# Patient Record
Sex: Male | Born: 1990 | Race: White | Hispanic: No | Marital: Single | Smoking: Former smoker
Health system: Southern US, Community
[De-identification: ages and names within clinical notes are randomized; demographics above are authoritative.]

## PROBLEM LIST (undated history)

## (undated) DIAGNOSIS — F329 Major depressive disorder, single episode, unspecified: Secondary | ICD-10-CM

## (undated) DIAGNOSIS — L03012 Cellulitis of left finger: Secondary | ICD-10-CM

## (undated) HISTORY — DX: Major depressive disorder, single episode, unspecified: F32.9

## (undated) HISTORY — DX: Cellulitis of left finger: L03.012

---
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
## 2010-06-11 ENCOUNTER — Ambulatory Visit: Payer: Self-pay | Admitting: Otolaryngology

## 2010-06-12 LAB — PATHOLOGY REPORT

## 2010-11-02 DIAGNOSIS — F32A Depression, unspecified: Secondary | ICD-10-CM

## 2010-11-02 HISTORY — DX: Depression, unspecified: F32.A

## 2013-11-02 DIAGNOSIS — L03012 Cellulitis of left finger: Secondary | ICD-10-CM

## 2013-11-02 HISTORY — DX: Cellulitis of left finger: L03.012

## 2016-01-13 ENCOUNTER — Ambulatory Visit: Payer: Self-pay | Admitting: Family Medicine

## 2016-05-25 ENCOUNTER — Encounter: Payer: Self-pay | Admitting: Family Medicine

## 2016-05-25 ENCOUNTER — Ambulatory Visit (INDEPENDENT_AMBULATORY_CARE_PROVIDER_SITE_OTHER): Payer: Self-pay | Admitting: Family Medicine

## 2016-05-25 VITALS — BP 124/78 | HR 68 | Temp 98.2°F | Ht 71.75 in | Wt 184.5 lb

## 2016-05-25 DIAGNOSIS — Z0000 Encounter for general adult medical examination without abnormal findings: Secondary | ICD-10-CM | POA: Insufficient documentation

## 2016-05-25 DIAGNOSIS — Z113 Encounter for screening for infections with a predominantly sexual mode of transmission: Secondary | ICD-10-CM | POA: Diagnosis not present

## 2016-05-25 DIAGNOSIS — Z131 Encounter for screening for diabetes mellitus: Secondary | ICD-10-CM

## 2016-05-25 DIAGNOSIS — Z13220 Encounter for screening for lipoid disorders: Secondary | ICD-10-CM

## 2016-05-25 DIAGNOSIS — F411 Generalized anxiety disorder: Secondary | ICD-10-CM | POA: Insufficient documentation

## 2016-05-25 DIAGNOSIS — F418 Other specified anxiety disorders: Secondary | ICD-10-CM

## 2016-05-25 LAB — BASIC METABOLIC PANEL
BUN: 12 mg/dL (ref 6–23)
CHLORIDE: 101 meq/L (ref 96–112)
CO2: 30 mEq/L (ref 19–32)
Calcium: 9.8 mg/dL (ref 8.4–10.5)
Creatinine, Ser: 0.93 mg/dL (ref 0.40–1.50)
GFR: 104.84 mL/min (ref >60.00)
GLUCOSE: 98 mg/dL (ref 70–99)
POTASSIUM: 4.4 meq/L (ref 3.5–5.1)
Sodium: 137 mEq/L (ref 135–145)

## 2016-05-25 LAB — LIPID PANEL
CHOLESTEROL: 174 mg/dL (ref 0–200)
HDL: 67.2 mg/dL (ref >39.00)
LDL Cholesterol: 97 mg/dL (ref 0–99)
NonHDL: 106.69
Total CHOL/HDL Ratio: 3
Triglycerides: 46 mg/dL (ref 0.0–149.0)
VLDL: 9.2 mg/dL (ref 0.0–40.0)

## 2016-05-25 LAB — TSH: TSH: 1.27 u[IU]/mL (ref 0.35–4.50)

## 2016-05-25 NOTE — Assessment & Plan Note (Addendum)
Mild anxiety currently minimally overwhelming - declines counseling or pharmacotherapy today. Discussed healthy stress relieving strategies. Will let me know if sxs deteriorating.  GAD7 = 6/27 PHQ9 = 5/21

## 2016-05-25 NOTE — Patient Instructions (Signed)
Good to meet you today. You are doing well. Labs today. Continue working on healthy stress relieving strategies.  Return as needed or in 1-2 yrs for next physical.  Health Maintenance, Male A healthy lifestyle and preventative care can promote health and wellness.  Maintain regular health, dental, and eye exams.  Eat a healthy diet. Foods like vegetables, fruits, whole grains, low-fat dairy products, and lean protein foods contain the nutrients you need and are low in calories. Decrease your intake of foods high in solid fats, added sugars, and salt. Get information about a proper diet from your health care provider, if necessary.  Regular physical exercise is one of the most important things you can do for your health. Most adults should get at least 150 minutes of moderate-intensity exercise (any activity that increases your heart rate and causes you to sweat) each week. In addition, most adults need muscle-strengthening exercises on 2 or more days a week.   Maintain a healthy weight. The body mass index (BMI) is a screening tool to identify possible weight problems. It provides an estimate of body fat based on height and weight. Your health care provider can find your BMI and can help you achieve or maintain a healthy weight. For males 20 years and older:  A BMI below 18.5 is considered underweight.  A BMI of 18.5 to 24.9 is normal.  A BMI of 25 to 29.9 is considered overweight.  A BMI of 30 and above is considered obese.  Maintain normal blood lipids and cholesterol by exercising and minimizing your intake of saturated fat. Eat a balanced diet with plenty of fruits and vegetables. Blood tests for lipids and cholesterol should begin at age 76 and be repeated every 5 years. If your lipid or cholesterol levels are high, you are over age 48, or you are at high risk for heart disease, you may need your cholesterol levels checked more frequently.Ongoing high lipid and cholesterol levels  should be treated with medicines if diet and exercise are not working.  If you smoke, find out from your health care provider how to quit. If you do not use tobacco, do not start.  Lung cancer screening is recommended for adults aged 55-80 years who are at high risk for developing lung cancer because of a history of smoking. A yearly low-dose CT scan of the lungs is recommended for people who have at least a 30-pack-year history of smoking and are current smokers or have quit within the past 15 years. A pack year of smoking is smoking an average of 1 pack of cigarettes a day for 1 year (for example, a 30-pack-year history of smoking could mean smoking 1 pack a day for 30 years or 2 packs a day for 15 years). Yearly screening should continue until the smoker has stopped smoking for at least 15 years. Yearly screening should be stopped for people who develop a health problem that would prevent them from having lung cancer treatment.  If you choose to drink alcohol, do not have more than 2 drinks per day. One drink is considered to be 12 oz (360 mL) of beer, 5 oz (150 mL) of wine, or 1.5 oz (45 mL) of liquor.  Avoid the use of street drugs. Do not share needles with anyone. Ask for help if you need support or instructions about stopping the use of drugs.  High blood pressure causes heart disease and increases the risk of stroke. High blood pressure is more likely to develop in:  People who have blood pressure in the end of the normal range (100-139/85-89 mm Hg).  People who are overweight or obese.  People who are African American.  If you are 32-23 years of age, have your blood pressure checked every 3-5 years. If you are 73 years of age or older, have your blood pressure checked every year. You should have your blood pressure measured twice--once when you are at a hospital or clinic, and once when you are not at a hospital or clinic. Record the average of the two measurements. To check your blood  pressure when you are not at a hospital or clinic, you can use:  An automated blood pressure machine at a pharmacy.  A home blood pressure monitor.  If you are 39-73 years old, ask your health care provider if you should take aspirin to prevent heart disease.  Diabetes screening involves taking a blood sample to check your fasting blood sugar level. This should be done once every 3 years after age 34 if you are at a normal weight and without risk factors for diabetes. Testing should be considered at a younger age or be carried out more frequently if you are overweight and have at least 1 risk factor for diabetes.  Colorectal cancer can be detected and often prevented. Most routine colorectal cancer screening begins at the age of 62 and continues through age 46. However, your health care provider may recommend screening at an earlier age if you have risk factors for colon cancer. On a yearly basis, your health care provider may provide home test kits to check for hidden blood in the stool. A small camera at the end of a tube may be used to directly examine the colon (sigmoidoscopy or colonoscopy) to detect the earliest forms of colorectal cancer. Talk to your health care provider about this at age 81 when routine screening begins. A direct exam of the colon should be repeated every 5-10 years through age 49, unless early forms of precancerous polyps or small growths are found.  People who are at an increased risk for hepatitis B should be screened for this virus. You are considered at high risk for hepatitis B if:  You were born in a country where hepatitis B occurs often. Talk with your health care provider about which countries are considered high risk.  Your parents were born in a high-risk country and you have not received a shot to protect against hepatitis B (hepatitis B vaccine).  You have HIV or AIDS.  You use needles to inject street drugs.  You live with, or have sex with, someone who  has hepatitis B.  You are a man who has sex with other men (MSM).  You get hemodialysis treatment.  You take certain medicines for conditions like cancer, organ transplantation, and autoimmune conditions.  Hepatitis C blood testing is recommended for all people born from [birth year] through 1965 and any individual with known risk factors for hepatitis C.  Healthy men should no longer receive prostate-specific antigen (PSA) blood tests as part of routine cancer screening. Talk to your health care provider about prostate cancer screening.  Testicular cancer screening is not recommended for adolescents or adult males who have no symptoms. Screening includes self-exam, a health care provider exam, and other screening tests. Consult with your health care provider about any symptoms you have or any concerns you have about testicular cancer.  Practice safe sex. Use condoms and avoid high-risk sexual practices to reduce the spread of  sexually transmitted infections (STIs).  You should be screened for STIs, including gonorrhea and chlamydia if:  You are sexually active and are younger than 24 years.  You are older than 24 years, and your health care provider tells you that you are at risk for this type of infection.  Your sexual activity has changed since you were last screened, and you are at an increased risk for chlamydia or gonorrhea. Ask your health care provider if you are at risk.  If you are at risk of being infected with HIV, it is recommended that you take a prescription medicine daily to prevent HIV infection. This is called pre-exposure prophylaxis (PrEP). You are considered at risk if:  You are a man who has sex with other men (MSM).  You are a heterosexual man who is sexually active with multiple partners.  You take drugs by injection.  You are sexually active with a partner who has HIV.  Talk with your health care provider about whether you are at high risk of being infected with  HIV. If you choose to begin PrEP, you should first be tested for HIV. You should then be tested every 3 months for as long as you are taking PrEP.  Use sunscreen. Apply sunscreen liberally and repeatedly throughout the day. You should seek shade when your shadow is shorter than you. Protect yourself by wearing long sleeves, pants, a wide-brimmed hat, and sunglasses year round whenever you are outdoors.  Tell your health care provider of new moles or changes in moles, especially if there is a change in shape or color. Also, tell your health care provider if a mole is larger than the size of a pencil eraser.  A one-time screening for abdominal aortic aneurysm (AAA) and surgical repair of large AAAs by ultrasound is recommended for men aged 73-75 years who are current or former smokers.  Stay current with your vaccines (immunizations).   This information is not intended to replace advice given to you by your health care provider. Make sure you discuss any questions you have with your health care provider.   Document Released: 04/16/2008 Document Revised: 11/09/2014 Document Reviewed: 03/16/2011 Elsevier Interactive Patient Education [employer].

## 2016-05-25 NOTE — Assessment & Plan Note (Signed)
Preventative protocols reviewed and updated unless pt declined. Discussed healthy diet and lifestyle.  

## 2016-05-25 NOTE — Progress Notes (Signed)
BP 124/78   Pulse 68   Temp 98.2 F (36.8 C) (Oral)   Ht 5' 11.75" (1.822 m)   Wt 184 lb 8 oz (83.7 kg)   BMI 25.20 kg/m    CC: new pt to establish care Subjective:    Patient ID: Jose Estes, male    DOB: [birth year], 25 y.o.   MRN: [id number]  HPI: [name] is a 25 y.o. male presenting on 05/25/2016 for Establish Care   Previously saw Dr Jacelyn Grip with [facility]. Will request records today.   Mild depression with anxiety 2012. He did take antidepressant for this which helped. Recently noticing mild depression resurface, anxiety > depression. Some overwhelmed feelings. 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 More stress at work.   Working out has helped. Runs 1 mi/day, some pull up. Wants to avoid medication at this time.   Fasting today.   Preventative: Last CPE unsure  Seat belt use discussed Sunscreen use discussed. No changing moles on skin.  Relevant past medical, surgical, family and social history reviewed and updated as indicated. Interim medical history since our last visit reviewed. Allergies and medications reviewed and updated. No current outpatient prescriptions on file prior to visit.   No current facility-administered medications on file prior to visit.     Review of Systems  Constitutional: Negative for activity change, appetite change, chills, fatigue, fever and unexpected weight change.  HENT: Negative for hearing loss.   Eyes: Negative for visual disturbance.  Respiratory: Negative for cough, chest tightness, shortness of breath and wheezing.   Cardiovascular: Negative for chest pain, palpitations and leg swelling.  Gastrointestinal: Negative for abdominal distention, abdominal pain, blood in stool, constipation, diarrhea, nausea and vomiting.  Genitourinary: Negative for difficulty urinating and hematuria.  Musculoskeletal: Negative for arthralgias, myalgias and neck pain.  Skin: Negative for rash.    Neurological: Negative for dizziness, seizures, syncope and headaches.  Hematological: Negative for adenopathy. Does not bruise/bleed easily.  Psychiatric/Behavioral: Negative for dysphoric mood. The patient is not nervous/anxious.    Per HPI unless specifically indicated in ROS section     Objective:    BP 124/78   Pulse 68   Temp 98.2 F (36.8 C) (Oral)   Ht 5' 11.75" (1.822 m)   Wt 184 lb 8 oz (83.7 kg)   BMI 25.20 kg/m   Wt Readings from Last 3 Encounters:  05/25/16 184 lb 8 oz (83.7 kg)    Physical Exam  Constitutional: He is oriented to person, place, and time. He appears well-developed and well-nourished. No distress.  HENT:  Head: Normocephalic and atraumatic.  Right Ear: Hearing, tympanic membrane, external ear and ear canal normal.  Left Ear: Hearing, tympanic membrane, external ear and ear canal normal.  Nose: Nose normal.  Mouth/Throat: Uvula is midline, oropharynx is clear and moist and mucous membranes are normal. No oropharyngeal exudate, posterior oropharyngeal edema or posterior oropharyngeal erythema.  Eyes: Conjunctivae and EOM are normal. Pupils are equal, round, and reactive to light. No scleral icterus.  Neck: Normal range of motion. Neck supple. No thyromegaly present.  Cardiovascular: Normal rate, regular rhythm, normal heart sounds and intact distal pulses.   No murmur heard. Pulses:      Radial pulses are 2+ on the right side, and 2+ on the left side.  Pulmonary/Chest: Effort normal and breath sounds normal. No respiratory distress. He has no wheezes. He has no rales.  Abdominal: Soft. Bowel sounds are normal. He exhibits  no distension and no mass. There is no tenderness. There is no rebound and no guarding.  Musculoskeletal: Normal range of motion. He exhibits no edema.  Lymphadenopathy:    He has no cervical adenopathy.  Neurological: He is alert and oriented to person, place, and time.  CN grossly intact, station and gait intact  Skin: Skin is  warm and dry. No rash noted.  Psychiatric: He has a normal mood and affect. His behavior is normal. Judgment and thought content normal.  Nursing note and vitals reviewed.  No results found for this or any previous visit.    Assessment & Plan:   Problem List Items Addressed This Visit    Anxiety associated with depression    Mild anxiety currently minimally overwhelming - declines counseling or pharmacotherapy today. Discussed healthy stress relieving strategies. Will let me know if sxs deteriorating.  GAD7 = 6/27 PHQ9 = 5/21      Relevant Orders   TSH   Health maintenance examination - Primary    Preventative protocols reviewed and updated unless pt declined. Discussed healthy diet and lifestyle.        Other Visit Diagnoses    Screen for STD (sexually transmitted disease)       Relevant Orders   HIV antibody   RPR   Lipid screening       Relevant Orders   Lipid panel   Diabetes mellitus screening       Relevant Orders   Basic metabolic panel       Follow up plan: Return in about 1 year (around 05/25/2017), or as needed, for annual exam, prior fasting for blood work.  Ria Bush, MD

## 2016-05-26 LAB — RPR

## 2016-05-26 LAB — HIV ANTIBODY (ROUTINE TESTING W REFLEX): HIV: NONREACTIVE

## 2016-05-28 ENCOUNTER — Encounter: Payer: Self-pay | Admitting: *Deleted

## 2016-06-21 ENCOUNTER — Encounter: Payer: Self-pay | Admitting: Family Medicine

## 2016-08-20 ENCOUNTER — Ambulatory Visit: Payer: Self-pay | Admitting: Family Medicine

## 2016-08-20 DIAGNOSIS — Z0289 Encounter for other administrative examinations: Secondary | ICD-10-CM

## 2016-08-27 ENCOUNTER — Ambulatory Visit: Payer: Self-pay | Admitting: Family Medicine

## 2016-08-28 ENCOUNTER — Encounter: Payer: Self-pay | Admitting: Family Medicine

## 2016-08-28 ENCOUNTER — Ambulatory Visit (INDEPENDENT_AMBULATORY_CARE_PROVIDER_SITE_OTHER): Payer: BC Managed Care – PPO | Admitting: Family Medicine

## 2016-08-28 VITALS — BP 112/70 | HR 88 | Temp 98.2°F | Wt 180.8 lb

## 2016-08-28 DIAGNOSIS — F411 Generalized anxiety disorder: Secondary | ICD-10-CM | POA: Diagnosis not present

## 2016-08-28 MED ORDER — BUPROPION HCL 75 MG PO TABS: 75.0000 mg | ORAL_TABLET | Freq: Two times a day (BID) | ORAL | 1 refills | Status: DC

## 2016-08-28 NOTE — Assessment & Plan Note (Signed)
Ongoing anxiety, noticed more with driving but also impairing ability to get tasks completed at job. Not consistent with ADHD - anticipate more anxiety related inattention. Discussed options. Pt is working on [employer], already has healthy stress relieving strategies. Interested in trial pharmacotherapy - will start wellbutrin in hopes of helping focus - 75mg  daily for 1 wk then increase to 75mg  BID. Discussed side effects and possible suicidality with starting antidepressant. RTC 4-6 wks f/u visit. If ineffective, consider strattera.

## 2016-08-28 NOTE — Progress Notes (Signed)
BP 112/70   Pulse 88   Temp 98.2 F (36.8 C) (Oral)   Wt 180 lb 12 oz (82 kg)   BMI 24.69 kg/m    CC: discuss anxiety/add Subjective:    Patient ID: Jose Estes, male    DOB: 1991-04-18, 25 y.o.   MRN: [id number]  HPI: Jose Estes is a 25 y.o. male presenting on 08/28/2016 for ADD and Anxiety   Established with me 05/2016, prior saw Dr Jacelyn Grip at [facility], records reviewed.   Last visit we discussed returning anxiety - pt was running as stress relieving strategy. 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   Recent car trip to visit younger sister in [place] - noticed increased anxiety with travel. Episodes described as difficulty swallowing, hyper focusing on this while driving, had to stop driving which helped. No dyspnea or palpitations. Also notices increased trouble focusing at work. Very behind at work, feeling overwhelmed with catching up at work. Goes to bed at 3am because of work load. Stays anxious about work load.   Thinks more trouble with anxiety and focus started 03/2016 - correlating with increased work load around that time.   He does take breaks at work, has standing desk which also helps.  He does have organization system in place as well.  No significant depression. No SI/HI.   No trouble in grade school and in community college 34yrs, then transferred to university - working on university degree planning on finishing 06/2017. As-Cs grades.   Relevant past medical, surgical, family and social history reviewed and updated as indicated. Interim medical history since our last visit reviewed. Allergies and medications reviewed and updated. No current outpatient prescriptions on file prior to visit.   No current facility-administered medications on file prior to visit.     Review of Systems Per HPI unless specifically indicated in ROS section     Objective:    BP 112/70   Pulse 88   Temp 98.2 F (36.8 C) (Oral)   Wt 180 lb 12 oz (82  kg)   BMI 24.69 kg/m   Wt Readings from Last 3 Encounters:  08/28/16 180 lb 12 oz (82 kg)  05/25/16 184 lb 8 oz (83.7 kg)    Physical Exam  Constitutional: He appears well-developed and well-nourished. No distress.  Neck: No thyromegaly present.  Cardiovascular: Normal rate, regular rhythm, normal heart sounds and intact distal pulses.   No murmur heard. Skin: Skin is warm and dry.  Psychiatric: He has a normal mood and affect. His behavior is normal. Judgment and thought content normal.  Nursing note and vitals reviewed.      Assessment & Plan:  Over 25 minutes were spent face-to-face with the patient during this encounter and >50% of that time was spent on counseling and coordination of care  Problem List Items Addressed This Visit    Anxiety state - Primary    Ongoing anxiety, noticed more with driving but also impairing ability to get tasks completed at job. Not consistent with ADHD - anticipate more anxiety related inattention. Discussed options. Pt is working on [employer], already has healthy stress relieving strategies. Interested in trial pharmacotherapy - will start wellbutrin in hopes of helping focus - 75mg  daily for 1 wk then increase to 75mg  BID. Discussed side effects and possible suicidality with starting antidepressant. RTC 4-6 wks f/u visit. If ineffective, consider strattera.       Relevant Medications   buPROPion (WELLBUTRIN) 75 MG tablet  Other Visit Diagnoses   None.      Follow up plan: Return in about 4 weeks (around 09/25/2016) for follow up visit.  Ria Bush, MD

## 2016-08-28 NOTE — Patient Instructions (Signed)
Start wellbutrin 75mg  once daily for 1 week, may increase to twice daily after a week if tolerated well. Full effect will be seen in 4 weeks. This should help with mood and focus.  If not tolerated, stop and let me know.  Return in 4-6 weeks for follow up visit.

## 2016-08-28 NOTE — Progress Notes (Signed)
Pre visit review using our clinic review tool, if applicable. No additional management support is needed unless otherwise documented below in the visit note. 

## 2016-09-28 ENCOUNTER — Ambulatory Visit: Payer: BC Managed Care – PPO | Admitting: Family Medicine

## 2016-09-28 DIAGNOSIS — Z0289 Encounter for other administrative examinations: Secondary | ICD-10-CM

## 2017-05-19 ENCOUNTER — Other Ambulatory Visit: Payer: Self-pay | Admitting: Family Medicine

## 2017-05-20 ENCOUNTER — Telehealth: Payer: Self-pay | Admitting: *Deleted

## 2017-05-20 NOTE — Telephone Encounter (Signed)
Attempted to reach patient, no option to leave voicemail. Will try again later.

## 2017-05-20 NOTE — Telephone Encounter (Signed)
-----   Message from Ria Bush, MD sent at 05/19/2017  6:26 PM EDT ----- Regarding: RE: Cpx labs Fri 7/20, need orders. Thanks :-) Does not need labs this year. Will just see for physical. Thanks, Garlon Hatchet  ----- Message ----- From: Marchia Bond Sent: 05/17/2017   3:48 PM To: Ria Bush, MD Subject: Cpx labs Fri 7/20, need orders. Thanks :-)     Please order  future cpx labs for pt's upcoming lab appt. Thanks Aniceto Boss

## 2017-05-20 NOTE — Telephone Encounter (Signed)
FYI, I attempted to call pt again, but saw that he called and canceled his cpx appt. There was a note that he has no insurance and will call back to reschedule cpx later.

## 2017-05-21 ENCOUNTER — Other Ambulatory Visit: Payer: BC Managed Care – PPO

## 2017-05-28 ENCOUNTER — Encounter: Payer: BC Managed Care – PPO | Admitting: Family Medicine

## 2017-11-25 ENCOUNTER — Other Ambulatory Visit: Payer: Self-pay | Admitting: Family Medicine

## 2017-11-25 DIAGNOSIS — Z0000 Encounter for general adult medical examination without abnormal findings: Secondary | ICD-10-CM

## 2017-11-26 ENCOUNTER — Other Ambulatory Visit (INDEPENDENT_AMBULATORY_CARE_PROVIDER_SITE_OTHER): Payer: BLUE CROSS/BLUE SHIELD

## 2017-11-26 DIAGNOSIS — Z0000 Encounter for general adult medical examination without abnormal findings: Secondary | ICD-10-CM | POA: Diagnosis not present

## 2017-11-26 LAB — LIPID PANEL
Cholesterol: 172 mg/dL (ref 0–200)
HDL: 54.5 mg/dL (ref >39.00)
LDL Cholesterol: 105 mg/dL — ABNORMAL HIGH (ref 0–99)
NonHDL: 117.1
TRIGLYCERIDES: 60 mg/dL (ref 0.0–149.0)
Total CHOL/HDL Ratio: 3
VLDL: 12 mg/dL (ref 0.0–40.0)

## 2017-11-26 LAB — BASIC METABOLIC PANEL
BUN: 14 mg/dL (ref 6–23)
CHLORIDE: 103 meq/L (ref 96–112)
CO2: 29 meq/L (ref 19–32)
CREATININE: 0.98 mg/dL (ref 0.40–1.50)
Calcium: 9 mg/dL (ref 8.4–10.5)
GFR: 97.55 mL/min (ref >60.00)
Glucose, Bld: 99 mg/dL (ref 70–99)
Potassium: 3.8 mEq/L (ref 3.5–5.1)
Sodium: 140 mEq/L (ref 135–145)

## 2017-12-01 ENCOUNTER — Encounter: Payer: Self-pay | Admitting: Family Medicine

## 2017-12-01 ENCOUNTER — Ambulatory Visit (INDEPENDENT_AMBULATORY_CARE_PROVIDER_SITE_OTHER): Payer: BLUE CROSS/BLUE SHIELD | Admitting: Family Medicine

## 2017-12-01 VITALS — BP 118/60 | HR 84 | Temp 98.6°F | Ht 71.0 in | Wt 194.0 lb

## 2017-12-01 DIAGNOSIS — Z1283 Encounter for screening for malignant neoplasm of skin: Secondary | ICD-10-CM | POA: Diagnosis not present

## 2017-12-01 DIAGNOSIS — Z0000 Encounter for general adult medical examination without abnormal findings: Secondary | ICD-10-CM

## 2017-12-01 DIAGNOSIS — F411 Generalized anxiety disorder: Secondary | ICD-10-CM

## 2017-12-01 NOTE — Patient Instructions (Addendum)
Check on latest tetanus shot (Td vs Tdap).  We will set you up with our counselor and a skin doctor.  You are doing well today.  Return as needed or in 1-2 years for next physical.   Health Maintenance, Male A healthy lifestyle and preventive care is important for your health and wellness. Ask your health care provider about what schedule of regular examinations is right for you. What should I know about weight and diet? Eat a Healthy Diet  Eat plenty of vegetables, fruits, whole grains, low-fat dairy products, and lean protein.  Do not eat a lot of foods high in solid fats, added sugars, or salt.  Maintain a Healthy Weight Regular exercise can help you achieve or maintain a healthy weight. You should:  Do at least 150 minutes of exercise each week. The exercise should increase your heart rate and make you sweat (moderate-intensity exercise).  Do strength-training exercises at least twice a week.  Watch Your Levels of Cholesterol and Blood Lipids  Have your blood tested for lipids and cholesterol every 5 years starting at 27 years of age. If you are at high risk for heart disease, you should start having your blood tested when you are 27 years old. You may need to have your cholesterol levels checked more often if: ? Your lipid or cholesterol levels are high. ? You are older than 27 years of age. ? You are at high risk for heart disease.  What should I know about cancer screening? Many types of cancers can be detected early and may often be prevented. Lung Cancer  You should be screened every year for lung cancer if: ? You are a current smoker who has smoked for at least 30 years. ? You are a former smoker who has quit within the past 15 years.  Talk to your health care provider about your screening options, when you should start screening, and how often you should be screened.  Colorectal Cancer  Routine colorectal cancer screening usually begins at 27 years of age and should  be repeated every 5-10 years until you are 27 years old. You may need to be screened more often if early forms of precancerous polyps or small growths are found. Your health care provider may recommend screening at an earlier age if you have risk factors for colon cancer.  Your health care provider may recommend using home test kits to check for hidden blood in the stool.  A small camera at the end of a tube can be used to examine your colon (sigmoidoscopy or colonoscopy). This checks for the earliest forms of colorectal cancer.  Prostate and Testicular Cancer  Depending on your age and overall health, your health care provider may do certain tests to screen for prostate and testicular cancer.  Talk to your health care provider about any symptoms or concerns you have about testicular or prostate cancer.  Skin Cancer  Check your skin from head to toe regularly.  Tell your health care provider about any new moles or changes in moles, especially if: ? There is a change in a mole's size, shape, or color. ? You have a mole that is larger than a pencil eraser.  Always use sunscreen. Apply sunscreen liberally and repeat throughout the day.  Protect yourself by wearing long sleeves, pants, a wide-brimmed hat, and sunglasses when outside.  What should I know about heart disease, diabetes, and high blood pressure?  If you are 21-57 years of age, have your blood  pressure checked every 3-5 years. If you are 69 years of age or older, have your blood pressure checked every year. You should have your blood pressure measured twice-once when you are at a hospital or clinic, and once when you are not at a hospital or clinic. Record the average of the two measurements. To check your blood pressure when you are not at a hospital or clinic, you can use: ? An automated blood pressure machine at a pharmacy. ? A home blood pressure monitor.  Talk to your health care provider about your target blood  pressure.  If you are between 10-25 years old, ask your health care provider if you should take aspirin to prevent heart disease.  Have regular diabetes screenings by checking your fasting blood sugar level. ? If you are at a normal weight and have a low risk for diabetes, have this test once every three years after the age of 101. ? If you are overweight and have a high risk for diabetes, consider being tested at a younger age or more often.  A one-time screening for abdominal aortic aneurysm (AAA) by ultrasound is recommended for men aged 70-75 years who are current or former smokers. What should I know about preventing infection? Hepatitis B If you have a higher risk for hepatitis B, you should be screened for this virus. Talk with your health care provider to find out if you are at risk for hepatitis B infection. Hepatitis C Blood testing is recommended for:  Everyone born from [birth year] through 1965.  Anyone with known risk factors for hepatitis C.  Sexually Transmitted Diseases (STDs)  You should be screened each year for STDs including gonorrhea and chlamydia if: ? You are sexually active and are younger than 27 years of age. ? You are older than 27 years of age and your health care provider tells you that you are at risk for this type of infection. ? Your sexual activity has changed since you were last screened and you are at an increased risk for chlamydia or gonorrhea. Ask your health care provider if you are at risk.  Talk with your health care provider about whether you are at high risk of being infected with HIV. Your health care provider may recommend a prescription medicine to help prevent HIV infection.  What else can I do?  Schedule regular health, dental, and eye exams.  Stay current with your vaccines (immunizations).  Do not use any tobacco products, such as cigarettes, chewing tobacco, and e-cigarettes. If you need help quitting, ask your health care  provider.  Limit alcohol intake to no more than 2 drinks per day. One drink equals 12 ounces of beer, 5 ounces of wine, or 1 ounces of hard liquor.  Do not use street drugs.  Do not share needles.  Ask your health care provider for help if you need support or information about quitting drugs.  Tell your health care provider if you often feel depressed.  Tell your health care provider if you have ever been abused or do not feel safe at home. This information is not intended to replace advice given to you by your health care provider. Make sure you discuss any questions you have with your health care provider. Document Released: 04/16/2008 Document Revised: 06/17/2016 Document Reviewed: 07/23/2015 Elsevier Interactive Patient Education  [employer].

## 2017-12-01 NOTE — Assessment & Plan Note (Addendum)
Ongoing situational anxiety over travel (driving, flights). Did not try wellbutrin. Hesitant for PRN anxiety medication due to sedation. Will refer to counseling. Pt agrees with plan.

## 2017-12-01 NOTE — Assessment & Plan Note (Addendum)
Preventative protocols reviewed and updated unless pt declined. Discussed healthy diet and lifestyle.  

## 2017-12-01 NOTE — Progress Notes (Signed)
BP 118/60 (BP Location: Left Arm, Patient Position: Sitting, Cuff Size: Normal)   Pulse 84   Temp 98.6 F (37 C) (Oral)   Ht 5\' 11"  (1.803 m)   Wt 194 lb (88 kg)   SpO2 97%   BMI 27.06 kg/m    CC: CPE Subjective:    Patient ID: Jose Estes, male    DOB: December 27, 1990, 27 y.o.   MRN: [id number]  HPI: Jose Estes is a 27 y.o. male presenting on 12/01/2017 for Annual Exam   Didn't try wellbutrin - too large a pill to swallow. Some anxiety with driving as well as fear of flying - this persists. Has not flown. Requests counseling referral. Father just moved to [place].   Preventative: Flu shot - declines Tetanus declined Seat belt use discussed. Sunscreen use discussed. Wants mole on back checked. Interested in removal. Would like to see derm. Ex smoker - quit E cigs 2 wks ago Alcohol - social Rec drugs - denies Currently sexually active - 1 partner - monogamous relationship. Declines STD screening.   Lives alone - GF Edu: [employer], [employer] online - media studies, finishing classes Occ: Videographer Activity: no regular exercises - planning on joining gym Diet: good water, green tea, fruits/vegetables daily, avoiding sodas  Relevant past medical, surgical, family and social history reviewed and updated as indicated. Interim medical history since our last visit reviewed. Allergies and medications reviewed and updated. Outpatient Medications Prior to Visit  Medication Sig Dispense Refill  . buPROPion (WELLBUTRIN) 75 MG tablet Take 1 tablet (75 mg total) by mouth 2 (two) times daily. (Patient not taking: Reported on 12/01/2017) 60 tablet 1   No facility-administered medications prior to visit.      Per HPI unless specifically indicated in ROS section below Review of Systems  Constitutional: Positive for appetite change (increase off nicotine). Negative for activity change, chills, fatigue, fever and unexpected weight change.  HENT: Negative for hearing loss.   Eyes: Negative  for visual disturbance.  Respiratory: Positive for wheezing (?at night time). Negative for cough, chest tightness and shortness of breath.   Cardiovascular: Negative for chest pain, palpitations and leg swelling.  Gastrointestinal: Negative for abdominal distention, abdominal pain, blood in stool, constipation, diarrhea, nausea and vomiting.  Genitourinary: Negative for difficulty urinating and hematuria.  Musculoskeletal: Negative for arthralgias, myalgias and neck pain.  Skin: Negative for rash.  Neurological: Negative for dizziness, seizures, syncope and headaches.  Hematological: Negative for adenopathy. Does not bruise/bleed easily.  Psychiatric/Behavioral: Negative for dysphoric mood. The patient is nervous/anxious (see HPI).        Objective:    BP 118/60 (BP Location: Left Arm, Patient Position: Sitting, Cuff Size: Normal)   Pulse 84   Temp 98.6 F (37 C) (Oral)   Ht 5\' 11"  (1.803 m)   Wt 194 lb (88 kg)   SpO2 97%   BMI 27.06 kg/m   Wt Readings from Last 3 Encounters:  12/01/17 194 lb (88 kg)  08/28/16 180 lb 12 oz (82 kg)  05/25/16 184 lb 8 oz (83.7 kg)    Physical Exam  Constitutional: He is oriented to person, place, and time. He appears well-developed and well-nourished. No distress.  HENT:  Head: Normocephalic and atraumatic.  Right Ear: Hearing, tympanic membrane, external ear and ear canal normal.  Left Ear: Hearing, tympanic membrane, external ear and ear canal normal.  Nose: Nose normal.  Mouth/Throat: Uvula is midline, oropharynx is clear and moist and mucous membranes are normal. No  oropharyngeal exudate, posterior oropharyngeal edema or posterior oropharyngeal erythema.  Eyes: Conjunctivae and EOM are normal. Pupils are equal, round, and reactive to light. No scleral icterus.  Neck: Normal range of motion. Neck supple. No thyromegaly present.  Cardiovascular: Normal rate, regular rhythm, normal heart sounds and intact distal pulses.  No murmur  heard. Pulses:      Radial pulses are 2+ on the right side, and 2+ on the left side.  Pulmonary/Chest: Effort normal and breath sounds normal. No respiratory distress. He has no wheezes. He has no rales.  Abdominal: Soft. Bowel sounds are normal. He exhibits no distension and no mass. There is no tenderness. There is no rebound and no guarding.  Musculoskeletal: Normal range of motion. He exhibits no edema.  Lymphadenopathy:    He has no cervical adenopathy.  Neurological: He is alert and oriented to person, place, and time.  CN grossly intact, station and gait intact  Skin: Skin is warm and dry. No rash noted.  Psychiatric: He has a normal mood and affect. His behavior is normal. Judgment and thought content normal.  Nursing note and vitals reviewed.  Results for orders placed or performed in visit on 54/65/03  Basic metabolic panel  Result Value Ref Range   Sodium 140 135 - 145 mEq/L   Potassium 3.8 3.5 - 5.1 mEq/L   Chloride 103 96 - 112 mEq/L   CO2 29 19 - 32 mEq/L   Glucose, Bld 99 70 - 99 mg/dL   BUN 14 6 - 23 mg/dL   Creatinine, Ser 0.98 0.40 - 1.50 mg/dL   Calcium 9.0 8.4 - 10.5 mg/dL   GFR 97.55 >60.00 mL/min  Lipid panel  Result Value Ref Range   Cholesterol 172 0 - 200 mg/dL   Triglycerides 60.0 0.0 - 149.0 mg/dL   HDL 54.50 >39.00 mg/dL   VLDL 12.0 0.0 - 40.0 mg/dL   LDL Cholesterol 105 (H) 0 - 99 mg/dL   Total CHOL/HDL Ratio 3    NonHDL 117.10       Assessment & Plan:   Problem List Items Addressed This Visit    Anxiety state    Ongoing situational anxiety over travel (driving, flights). Did not try wellbutrin. Hesitant for PRN anxiety medication due to sedation. Will refer to counseling. Pt agrees with plan.       Relevant Orders   Ambulatory referral to Psychology   Health maintenance examination - Primary    Preventative protocols reviewed and updated unless pt declined. Discussed healthy diet and lifestyle.        Other Visit Diagnoses    Skin  cancer screening       Relevant Orders   Ambulatory referral to Dermatology       Follow up plan: Return in about 1 year (around 12/01/2018) for annual exam, prior fasting for blood work.  Ria Bush, MD

## 2017-12-09 ENCOUNTER — Ambulatory Visit: Payer: BLUE CROSS/BLUE SHIELD | Admitting: Family Medicine

## 2017-12-09 ENCOUNTER — Encounter: Payer: Self-pay | Admitting: Family Medicine

## 2017-12-09 VITALS — BP 118/68 | HR 83 | Temp 97.9°F | Wt 194.0 lb

## 2017-12-09 DIAGNOSIS — F411 Generalized anxiety disorder: Secondary | ICD-10-CM

## 2017-12-09 MED ORDER — HYDROXYZINE HCL 25 MG PO TABS: 12.5000 mg | ORAL_TABLET | Freq: Two times a day (BID) | ORAL | 0 refills | Status: DC | PRN

## 2017-12-09 NOTE — Progress Notes (Signed)
   BP 118/68 (BP Location: Left Arm, Patient Position: Sitting, Cuff Size: Normal)   Pulse 83   Temp 97.9 F (36.6 C) (Oral)   Wt 194 lb (88 kg)   SpO2 96%   BMI 27.06 kg/m    CC: anxiety  Subjective:    Patient ID: Jose Estes, male    DOB: 1991/09/26, 27 y.o.   MRN: [id number]  HPI: Jose Estes is a 27 y.o. male presenting on 12/09/2017 for Anxiety   See prior note for details. Ongoing anxiety around driving. Last visit we referred to counselor.  wellbutrin pill was too large to swallow.   Sunday this past week woke up with burning pain in throat and some trouble breathing "felt like suffocating" that led to increased anxiety since then. This lasted 30-45 seconds. Possible heartburn related.   He has previously awoken gasping for air. No snoring. No witnessed apnea. Some daytime somnolence. Endorses restorative sleep. No fmhx sleep apnea. Anxiety does run in family.   Endorses mild seasonal depression. Enjoys job. Denies anhedonia or significant depressed mood.   He has tried B complex, st john's wort, tranquilene.  Discussed stress relieving strategies  Relevant past medical, surgical, family and social history reviewed and updated as indicated. Interim medical history since our last visit reviewed. Allergies and medications reviewed and updated. No outpatient medications prior to visit.   No facility-administered medications prior to visit.      Per HPI unless specifically indicated in ROS section below Review of Systems     Objective:    BP 118/68 (BP Location: Left Arm, Patient Position: Sitting, Cuff Size: Normal)   Pulse 83   Temp 97.9 F (36.6 C) (Oral)   Wt 194 lb (88 kg)   SpO2 96%   BMI 27.06 kg/m   Wt Readings from Last 3 Encounters:  12/09/17 194 lb (88 kg)  12/01/17 194 lb (88 kg)  08/28/16 180 lb 12 oz (82 kg)    Physical Exam  Constitutional: He appears well-developed and well-nourished. No distress.  Psychiatric: He has a normal mood and  affect.  Nursing note and vitals reviewed.     Assessment & Plan:   Problem List Items Addressed This Visit    Anxiety state - Primary    Anxiety with recent attack at night that may have come on after GERD symptoms. However pt endorses overall worsening anxiety. Reviewed healthy stress relieving strategies. Pending counseling appt to further explore phobias. Reviewed medication management options - pt and I both want to avoid benzos/habit forming medication if able. Will trial hydroxyzine 25mg  1/2-1 tab PRN and he will update me with effect in 1-2 wks. If persistent or worsening, discussed daily mood medication.  Some paroxysmal nocturnal dyspnea without other OSA sxs - merits monitoring PHQ9 = 11 GAD7 = 8 ESS = 9      Relevant Medications   hydrOXYzine (ATARAX/VISTARIL) 25 MG tablet       Follow up plan: Return if symptoms worsen or fail to improve.  Ria Bush, MD

## 2017-12-09 NOTE — Assessment & Plan Note (Signed)
Anxiety with recent attack at night that may have come on after GERD symptoms. However pt endorses overall worsening anxiety. Reviewed healthy stress relieving strategies. Pending counseling appt to further explore phobias. Reviewed medication management options - pt and I both want to avoid benzos/habit forming medication if able. Will trial hydroxyzine 25mg  1/2-1 tab PRN and he will update me with effect in 1-2 wks. If persistent or worsening, discussed daily mood medication.  Some paroxysmal nocturnal dyspnea without other OSA sxs - merits monitoring PHQ9 = 11 GAD7 = 8 ESS = 9

## 2017-12-09 NOTE — Patient Instructions (Addendum)
If you have time today, stop by to see Jose Estes on your way out.  Try hydroxyzine 1/2-1 tablet as needed for anxiety.  Update me with effect in 1-2 weeks. If not getting better, we may discuss daily medication.

## 2017-12-17 ENCOUNTER — Ambulatory Visit (INDEPENDENT_AMBULATORY_CARE_PROVIDER_SITE_OTHER): Payer: BLUE CROSS/BLUE SHIELD | Admitting: Psychology

## 2017-12-17 DIAGNOSIS — F410 Panic disorder [episodic paroxysmal anxiety] without agoraphobia: Secondary | ICD-10-CM

## 2017-12-24 ENCOUNTER — Ambulatory Visit (INDEPENDENT_AMBULATORY_CARE_PROVIDER_SITE_OTHER): Payer: BLUE CROSS/BLUE SHIELD | Admitting: Psychology

## 2017-12-24 DIAGNOSIS — F410 Panic disorder [episodic paroxysmal anxiety] without agoraphobia: Secondary | ICD-10-CM | POA: Diagnosis not present

## 2017-12-31 ENCOUNTER — Ambulatory Visit (INDEPENDENT_AMBULATORY_CARE_PROVIDER_SITE_OTHER): Payer: BLUE CROSS/BLUE SHIELD | Admitting: Psychology

## 2017-12-31 DIAGNOSIS — F410 Panic disorder [episodic paroxysmal anxiety] without agoraphobia: Secondary | ICD-10-CM

## 2018-01-06 ENCOUNTER — Encounter: Payer: Self-pay | Admitting: Family Medicine

## 2018-01-07 ENCOUNTER — Ambulatory Visit (INDEPENDENT_AMBULATORY_CARE_PROVIDER_SITE_OTHER): Payer: BLUE CROSS/BLUE SHIELD | Admitting: Psychology

## 2018-01-07 DIAGNOSIS — F410 Panic disorder [episodic paroxysmal anxiety] without agoraphobia: Secondary | ICD-10-CM | POA: Diagnosis not present

## 2018-01-14 ENCOUNTER — Ambulatory Visit: Payer: BLUE CROSS/BLUE SHIELD | Admitting: Psychology

## 2018-01-19 ENCOUNTER — Ambulatory Visit: Payer: BLUE CROSS/BLUE SHIELD | Admitting: Psychology

## 2018-01-24 ENCOUNTER — Ambulatory Visit: Payer: BLUE CROSS/BLUE SHIELD | Admitting: Psychology

## 2018-03-23 ENCOUNTER — Ambulatory Visit: Payer: Self-pay

## 2018-03-23 NOTE — Telephone Encounter (Signed)
Patient called in with c/o "chest pain." He says "it started this morning, very mild about a 1-2, under my left pectoris. It comes and goes and lasts a few seconds. I also woke up last night gasping for air, which is not a new problem. I've talked to the doctor about this before. I just want to be checked out to make sure nothing is going on with my heart." I asked is the pain radiating anywhere else, he says "no, it just stays there." I asked about other symptoms, he says "no, but I did sweat some during the night." According to protocol, see PCP within 24 hours, appointment scheduled for tomorrow at 1200 with Dr. Danise Mina, care advice given, patient verbalized understanding.   Reason for Disposition . [1] Chest pain lasting <= 5 minutes AND [2] NO chest pain or cardiac symptoms now(Exceptions: pains lasting a few seconds)  Answer Assessment - Initial Assessment Questions 1. LOCATION: "Where does it hurt?"       Under left pectoris 2. RADIATION: "Does the pain go anywhere else?" (e.g., into neck, jaw, arms, back)     No 3. ONSET: "When did the chest pain begin?" (Minutes, hours or days)      Today 4. PATTERN "Does the pain come and go, or has it been constant since it started?"  "Does it get worse with exertion?"      Comes and goes 5. DURATION: "How long does it last" (e.g., seconds, minutes, hours)     Seconds 6. SEVERITY: "How bad is the pain?"  (e.g., Scale 1-10; mild, moderate, or severe)    - MILD (1-3): doesn't interfere with normal activities     - MODERATE (4-7): interferes with normal activities or awakens from sleep    - SEVERE (8-10): excruciating pain, unable to do any normal activities       1-2, mild ache 7. CARDIAC RISK FACTORS: "Do you have any history of heart problems or risk factors for heart disease?" (e.g., prior heart attack, angina; high blood pressure, diabetes, being overweight, high cholesterol, smoking, or strong family history of heart disease)     No; stopped  vaping 3 days ago 8. PULMONARY RISK FACTORS: "Do you have any history of lung disease?"  (e.g., blood clots in lung, asthma, emphysema, birth control pills)     No 9. CAUSE: "What do you think is causing the chest pain?"     I don't know 10. OTHER SYMPTOMS: "Do you have any other symptoms?" (e.g., dizziness, nausea, vomiting, sweating, fever, difficulty breathing, cough)       Maybe sweating during the night 11. PREGNANCY: "Is there any chance you are pregnant?" "When was your last menstrual period?"       N/A  Protocols used: CHEST PAIN-A-AH

## 2018-03-24 ENCOUNTER — Ambulatory Visit: Payer: BLUE CROSS/BLUE SHIELD | Admitting: Family Medicine

## 2018-03-24 ENCOUNTER — Encounter: Payer: Self-pay | Admitting: Family Medicine

## 2018-03-24 VITALS — BP 116/66 | HR 66 | Temp 98.3°F | Ht 71.0 in | Wt 198.0 lb

## 2018-03-24 DIAGNOSIS — F411 Generalized anxiety disorder: Secondary | ICD-10-CM

## 2018-03-24 NOTE — Progress Notes (Signed)
BP 116/66 (BP Location: Left Arm, Patient Position: Sitting, Cuff Size: Normal)   Pulse 66   Temp 98.3 F (36.8 C) (Oral)   Ht 5\' 11"  (1.803 m)   Wt 198 lb (89.8 kg)   SpO2 98%   BMI 27.62 kg/m    CC: check heart Subjective:    Patient ID: Jose Estes, male    DOB: 07/04/1991, 27 y.o.   MRN: [id number]  HPI: Jose Estes is a 27 y.o. male presenting on 03/24/2018 for Chest Pain (Had mild chest pain yesterday. Thinks it was gas.  States he wakes gasping for air. Just wants to be checked. )   2 months ago restarted Ecig (3mg  every 3-4 days). Again quit 4 days ago.   Some trouble falling asleep at night time. This started after he quit smoking. Mild headache last few days.  Intermittent inconsistent chest discomfort described as mild discomfort mid chest, no pleurisy. Notes chest feels tighter when he lays supine.   Denies cough, wheezing, dyspnea or exertional chest pain.   H/o anxiety. Hydroxyzine 12.5-25mg  was to sedating.  Has been treating anxiety with tranquilene.   Increased stress recently - currently in wedding season - high time for his work.  Walking more with GF. Wants to restart gym eercise routine.   Relevant past medical, surgical, family and social history reviewed and updated as indicated. Interim medical history since our last visit reviewed. Allergies and medications reviewed and updated. Outpatient Medications Prior to Visit  Medication Sig Dispense Refill  . hydrOXYzine (ATARAX/VISTARIL) 25 MG tablet Take 0.5-1 tablets (12.5-25 mg total) by mouth 2 (two) times daily as needed for anxiety (sedation precautions). (Patient not taking: Reported on 03/24/2018) 30 tablet 0   No facility-administered medications prior to visit.      Per HPI unless specifically indicated in ROS section below Review of Systems     Objective:    BP 116/66 (BP Location: Left Arm, Patient Position: Sitting, Cuff Size: Normal)   Pulse 66   Temp 98.3 F (36.8 C) (Oral)   Ht  5\' 11"  (1.803 m)   Wt 198 lb (89.8 kg)   SpO2 98%   BMI 27.62 kg/m   Wt Readings from Last 3 Encounters:  03/24/18 198 lb (89.8 kg)  12/09/17 194 lb (88 kg)  12/01/17 194 lb (88 kg)    Physical Exam  Constitutional: He appears well-developed and well-nourished. No distress.  HENT:  Head: Normocephalic and atraumatic.  Eyes: Pupils are equal, round, and reactive to light. EOM are normal.  Neck: Normal range of motion. Neck supple. No thyromegaly present.  Cardiovascular: Normal rate, regular rhythm and normal pulses.  No murmur heard. Pulmonary/Chest: Effort normal and breath sounds normal. No respiratory distress. He has no decreased breath sounds. He has no wheezes. He has no rhonchi. He has no rales.  Neurological: He is alert.  Skin: Skin is warm and dry.  Psychiatric: He has a normal mood and affect. His behavior is normal.  Nursing note and vitals reviewed.  Lab Results  Component Value Date   TSH 1.27 05/25/2016       Assessment & Plan:   Problem List Items Addressed This Visit    Anxiety state - Primary    Some intermittent chest discomfort and waking up gasping that is not cardiac or pulmonary in nature - benign lung and heart exam, no personal h/o asthma, no fmhx premature CAD. Anticipate anxiety related. OTC tranquilene helps - ok to continue. Pt desires to  avoid lower dose hydroxyzine for now. Encouraged healthy stress relieving strategies. Encouraged avoid smoking/E cig.           No orders of the defined types were placed in this encounter.  No orders of the defined types were placed in this encounter.   Follow up plan: No follow-ups on file.  Ria Bush, MD

## 2018-03-24 NOTE — Assessment & Plan Note (Addendum)
Some intermittent chest discomfort and waking up gasping that is not cardiac or pulmonary in nature - benign lung and heart exam, no personal h/o asthma, no fmhx premature CAD. Anticipate anxiety related. OTC tranquilene helps - ok to continue. Pt desires to avoid lower dose hydroxyzine for now. Encouraged healthy stress relieving strategies. Encouraged avoid smoking/E cig.

## 2018-10-05 ENCOUNTER — Encounter: Payer: Self-pay | Admitting: Family Medicine

## 2018-10-10 MED ORDER — LORAZEPAM 0.5 MG PO TABS: 0.2500 mg | ORAL_TABLET | Freq: Two times a day (BID) | ORAL | 0 refills | Status: DC | PRN

## 2019-01-09 IMAGING — US ULTRASOUND SCROTUM DOPPLER COMPLETE
1 series · 14 of 25 positions shown · non-contrast
Comparison: None.

CLINICAL DATA: Left-sided testicular pain

EXAM:
SCROTAL ULTRASOUND
DOPPLER ULTRASOUND OF THE TESTICLES
TECHNIQUE: Complete ultrasound examination of the testicles, epididymis, and
other scrotal structures was performed. Color and spectral Doppler
ultrasound were also utilized to evaluate blood flow to the
testicles.

[Series 1: ultrasound scrotum doppler complete · 0.06mm/px · 14 of 77 slices shown]
[im 1/77]
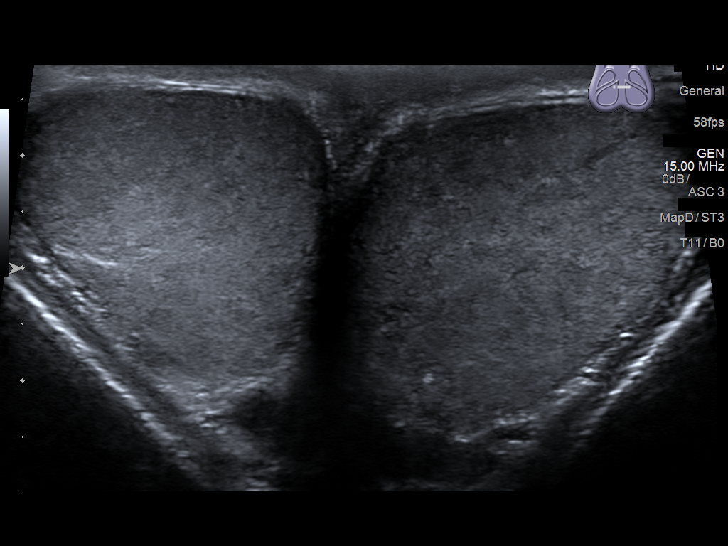
[im 7/77]
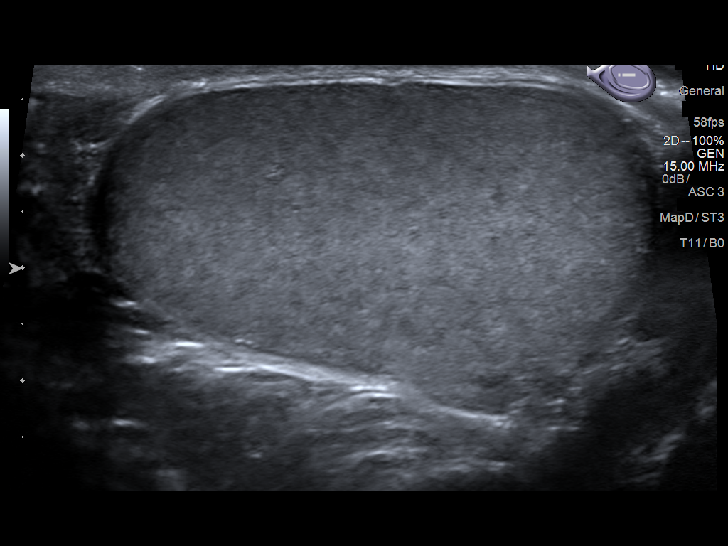
[im 13/77]
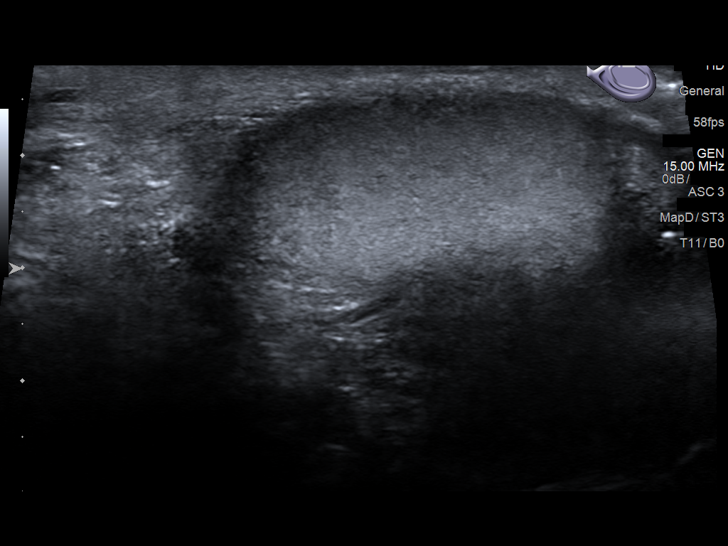
[im 20/77]
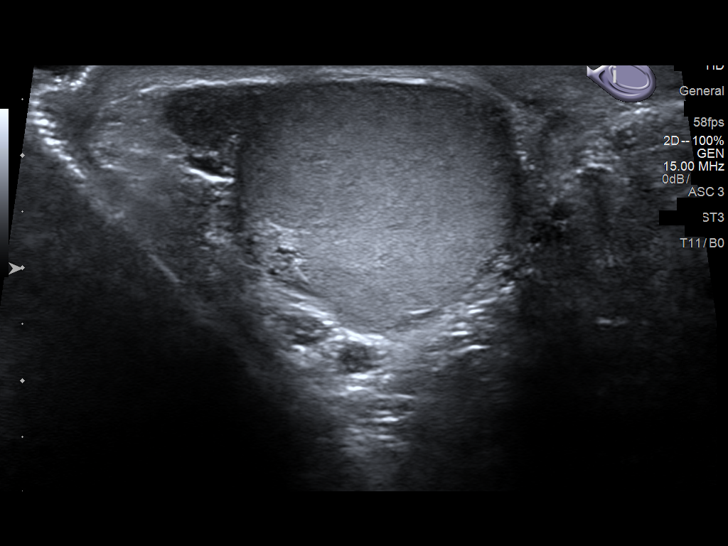
[im 26/77]
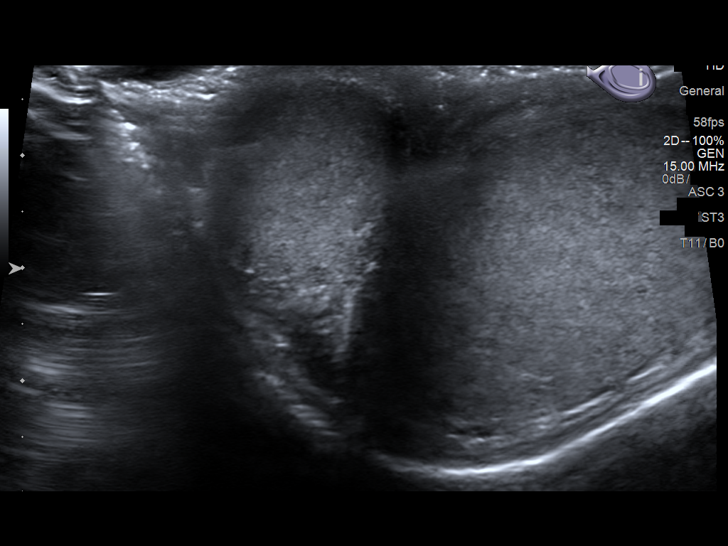
[im 29/77]
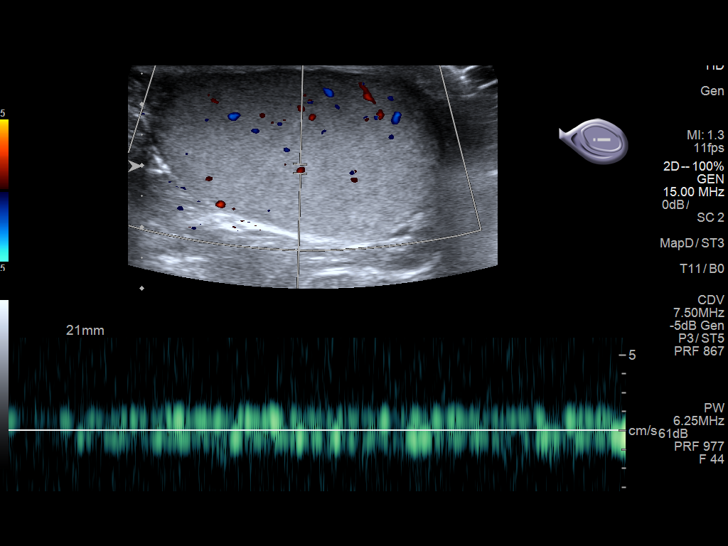
[im 35/77]
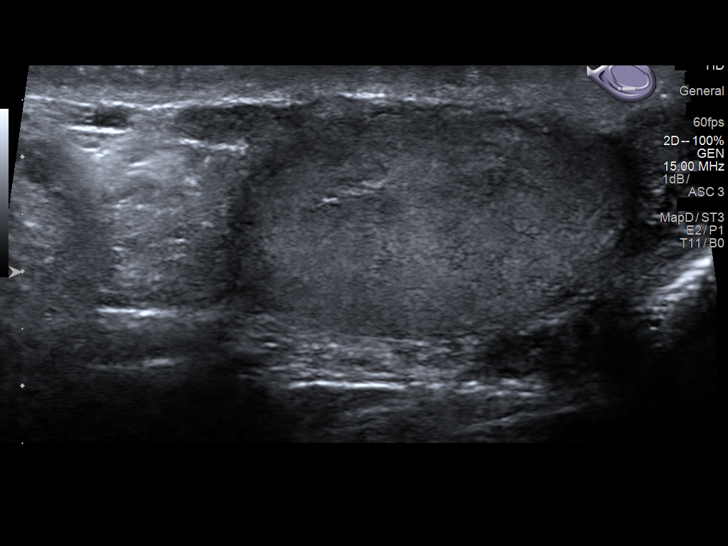
[im 42/77]
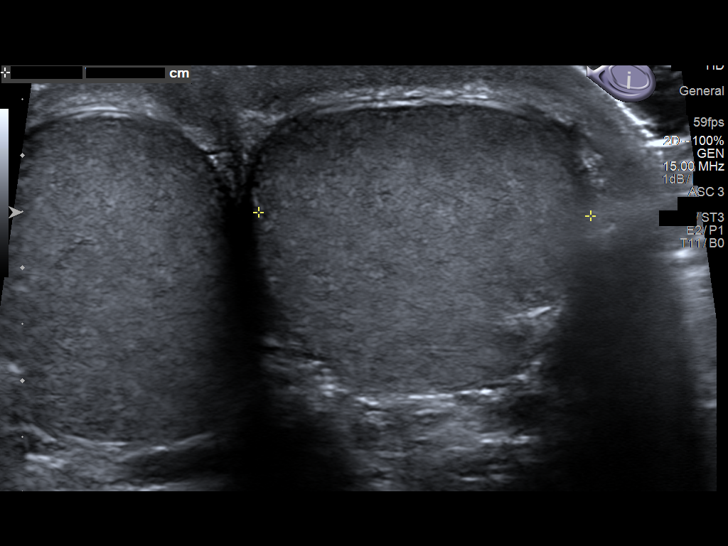
[im 48/77]
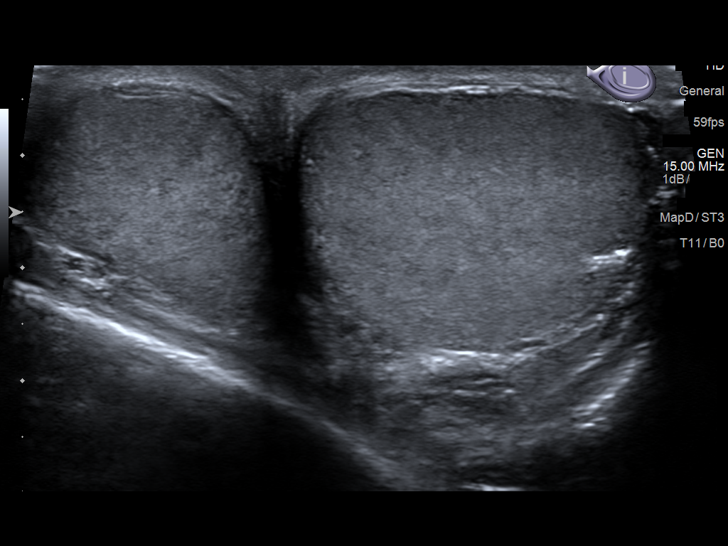
[im 51/77]
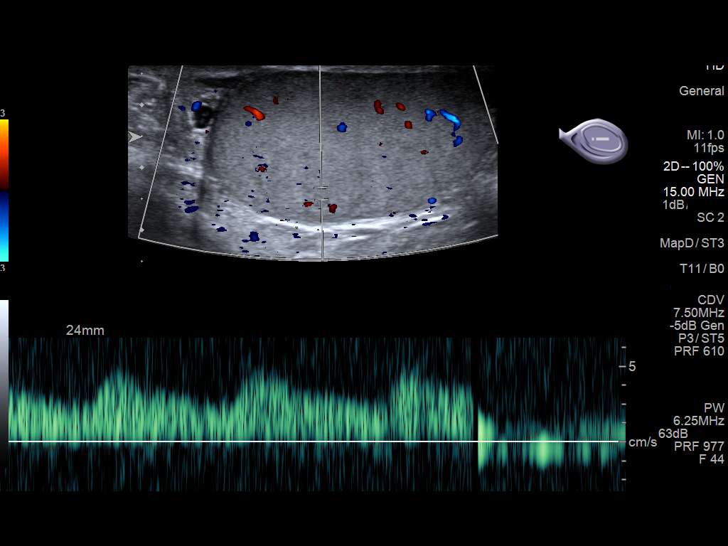
[im 58/77]
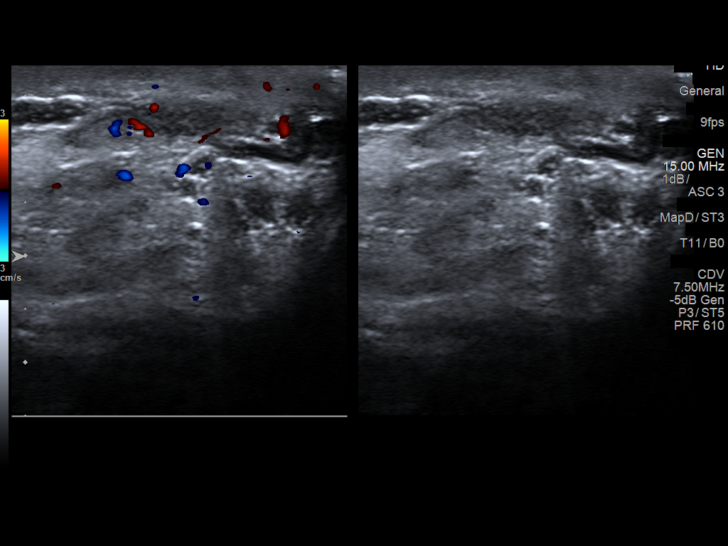
[im 64/77]
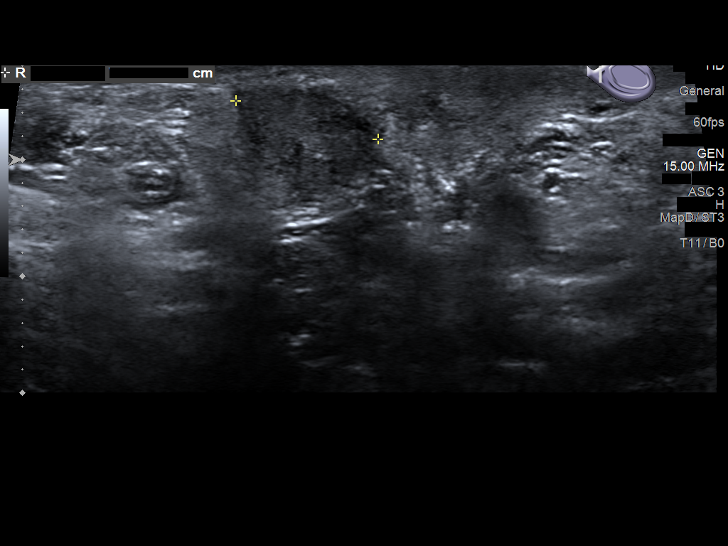
[im 70/77]
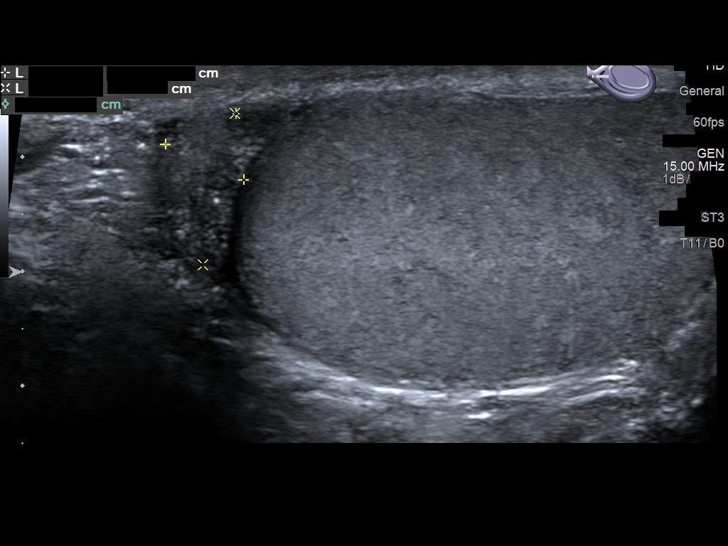
[im 77/77]
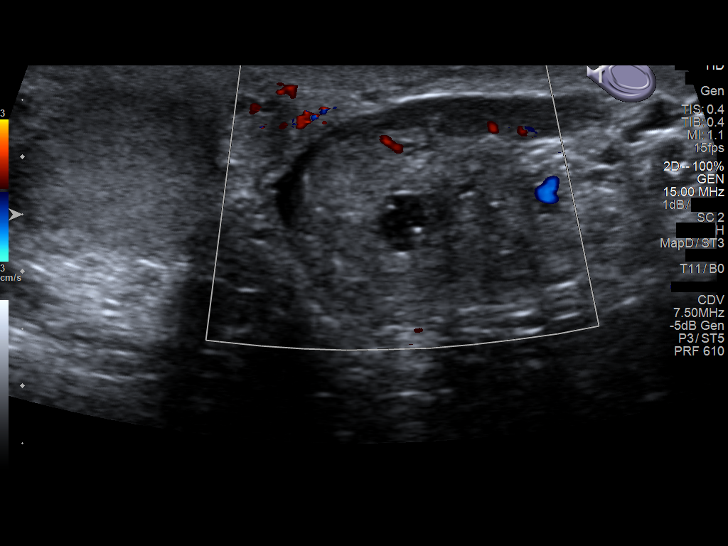

[14 of 25 positions shown; findings below may reference images not displayed]

FINDINGS: Right testicle

Measurements: 4.9 x 2.5 x 2.9 cm. No mass or microlithiasis
visualized.

Left testicle

Measurements: 4.4 x 2.6 x 3 cm. No mass or microlithiasis
visualized.

Right epididymis: There is a small 0.4 x 0.4 x 0.5 cm hypoechoic
nodule in the epididymis, favored to represent a slightly complex
cyst.

Left epididymis: There is a small left-sided epididymal head cyst
measuring 0.4 x 0.5 x 0.3 cm.

Hydrocele:  None visualized.

Varicocele:  None visualized.

Pulsed Doppler interrogation of both testes demonstrates normal low
resistance arterial and venous waveforms bilaterally.
IMPRESSION: No acute sonographic abnormality. No evidence for testicular
torsion. No findings to explain the patient's left-sided pain.

## 2019-02-22 ENCOUNTER — Encounter: Payer: Self-pay | Admitting: Family Medicine

## 2019-04-11 ENCOUNTER — Other Ambulatory Visit: Payer: Self-pay | Admitting: Family Medicine

## 2019-04-11 DIAGNOSIS — E785 Hyperlipidemia, unspecified: Secondary | ICD-10-CM

## 2019-04-12 ENCOUNTER — Other Ambulatory Visit (INDEPENDENT_AMBULATORY_CARE_PROVIDER_SITE_OTHER): Payer: BLUE CROSS/BLUE SHIELD

## 2019-04-12 DIAGNOSIS — E785 Hyperlipidemia, unspecified: Secondary | ICD-10-CM

## 2019-04-12 LAB — COMPREHENSIVE METABOLIC PANEL
ALT: 51 U/L (ref 0–53)
AST: 22 U/L (ref 0–37)
Albumin: 4.5 g/dL (ref 3.5–5.2)
Alkaline Phosphatase: 45 U/L (ref 39–117)
BUN: 14 mg/dL (ref 6–23)
CO2: 28 mEq/L (ref 19–32)
Calcium: 9.1 mg/dL (ref 8.4–10.5)
Chloride: 106 mEq/L (ref 96–112)
Creatinine, Ser: 0.91 mg/dL (ref 0.40–1.50)
GFR: 98.97 mL/min (ref >60.00)
Glucose, Bld: 92 mg/dL (ref 70–99)
Potassium: 4.1 mEq/L (ref 3.5–5.1)
Sodium: 140 mEq/L (ref 135–145)
Total Bilirubin: 0.5 mg/dL (ref 0.2–1.2)
Total Protein: 6.4 g/dL (ref 6.0–8.3)

## 2019-04-12 LAB — LIPID PANEL
Cholesterol: 181 mg/dL (ref 0–200)
HDL: 51.9 mg/dL (ref >39.00)
LDL Cholesterol: 117 mg/dL — ABNORMAL HIGH (ref 0–99)
NonHDL: 128.68
Total CHOL/HDL Ratio: 3
Triglycerides: 57 mg/dL (ref 0.0–149.0)
VLDL: 11.4 mg/dL (ref 0.0–40.0)

## 2019-04-18 ENCOUNTER — Ambulatory Visit (INDEPENDENT_AMBULATORY_CARE_PROVIDER_SITE_OTHER): Payer: BLUE CROSS/BLUE SHIELD | Admitting: Family Medicine

## 2019-04-18 ENCOUNTER — Ambulatory Visit
Admission: RE | Admit: 2019-04-18 | Discharge: 2019-04-18 | Disposition: A | Discharge status: Home / Self Care | Payer: BLUE CROSS/BLUE SHIELD | Source: Ambulatory Visit | Attending: Family Medicine | Admitting: Family Medicine

## 2019-04-18 ENCOUNTER — Encounter: Payer: Self-pay | Admitting: Family Medicine

## 2019-04-18 ENCOUNTER — Other Ambulatory Visit: Payer: Self-pay

## 2019-04-18 VITALS — BP 120/76 | HR 68 | Temp 98.2°F | Ht 70.5 in | Wt 198.2 lb

## 2019-04-18 DIAGNOSIS — N50812 Left testicular pain: Secondary | ICD-10-CM

## 2019-04-18 DIAGNOSIS — Z23 Encounter for immunization: Secondary | ICD-10-CM | POA: Diagnosis not present

## 2019-04-18 DIAGNOSIS — Z0000 Encounter for general adult medical examination without abnormal findings: Secondary | ICD-10-CM

## 2019-04-18 DIAGNOSIS — Z0001 Encounter for general adult medical examination with abnormal findings: Secondary | ICD-10-CM

## 2019-04-18 DIAGNOSIS — F411 Generalized anxiety disorder: Secondary | ICD-10-CM | POA: Diagnosis not present

## 2019-04-18 LAB — POC URINALSYSI DIPSTICK (AUTOMATED)
Bilirubin, UA: NEGATIVE
Blood, UA: NEGATIVE
Glucose, UA: NEGATIVE
Ketones, UA: NEGATIVE
Leukocytes, UA: NEGATIVE
Nitrite, UA: NEGATIVE
Protein, UA: NEGATIVE
Spec Grav, UA: 1.015 (ref 1.010–1.025)
Urobilinogen, UA: 0.2 E.U./dL
pH, UA: 7 (ref 5.0–8.0)

## 2019-04-18 MED ORDER — SULFAMETHOXAZOLE-TRIMETHOPRIM 800-160 MG PO TABS: 1.0000 | ORAL_TABLET | Freq: Two times a day (BID) | ORAL | 0 refills | Status: DC

## 2019-04-18 NOTE — Assessment & Plan Note (Signed)
Stable period off medication. Going on walks helps with anxiety.

## 2019-04-18 NOTE — Addendum Note (Signed)
Addended by: Brenton Grills on: 2/95/7473 40:37 PM   Modules accepted: Orders

## 2019-04-18 NOTE — Addendum Note (Signed)
Addended by: Ellamae Sia on: 04/18/2019 02:05 PM   Modules accepted: Orders

## 2019-04-18 NOTE — Addendum Note (Signed)
Addended by: Ellamae Sia on: 04/18/2019 02:53 PM   Modules accepted: Orders

## 2019-04-18 NOTE — Progress Notes (Signed)
This visit was conducted in person.  BP 120/76 (BP Location: Left Arm, Patient Position: Sitting, Cuff Size: Normal)   Pulse 68   Temp 98.2 F (36.8 C) (Oral)   Ht 5' 10.5" (1.791 m)   Wt 198 lb 3 oz (89.9 kg)   SpO2 97%   BMI 28.04 kg/m    CC: CPE Subjective:    Patient ID: Jose Estes, male    DOB: [birth year], 28 y.o.   MRN: [id number]  HPI: Jose Estes is a 28 y.o. male presenting on 04/18/2019 for Annual Exam and Testicle Pain (C/o left testicle pain radiating around to lower left back. Also, c/o urinary frequency. )   Working part time job Catering manager at [employer]   Anxiety - stable period. Has puppy at home. Going on regular walks.   Yesterday started noticing some left testicular discomfort to palpation with radiation to LLQ and L lower back. Increased urination noted today. No swollen glands, rashes, urethral discharge, no blood in urine, no rectal pain.   Monogamous relationship.   Preventative: Flu shot - declines Tdap today Seat belt use discussed.  Sunscreen use discussed. Wants mole on back checked.  Ex smoker - quit E cigs 01/2019!  Alcohol - seldom  Rec drugs - denies  Currently sexually active - 1 partner - monogamous relationship for last few years.  Dentist q6 mo Eye exam - due  Lives alone with GF  Edu: [employer], [employer] online - media studies, finishing classes Occ: Videographer Activity: walking regularly.  Diet: good water, fruits/vegetables daily, avoiding sodas  Caffeine: 1.5 cups coffee/day     Relevant past medical, surgical, family and social history reviewed and updated as indicated. Interim medical history since our last visit reviewed. Allergies and medications reviewed and updated. Outpatient Medications Prior to Visit  Medication Sig Dispense Refill  . LORazepam (ATIVAN) 0.5 MG tablet Take 0.5-1 tablets (0.25-0.5 mg total) by mouth 2 (two) times daily as needed for anxiety (for flight). 4 tablet 0   No facility-administered  medications prior to visit.      Per HPI unless specifically indicated in ROS section below Review of Systems  Constitutional: Negative for activity change, appetite change, chills, fatigue, fever and unexpected weight change.  HENT: Negative for hearing loss.   Eyes: Negative for visual disturbance.  Respiratory: Negative for cough, chest tightness, shortness of breath and wheezing.   Cardiovascular: Negative for chest pain, palpitations and leg swelling.  Gastrointestinal: Negative for abdominal distention, abdominal pain, blood in stool, constipation, diarrhea, nausea and vomiting.  Genitourinary: Negative for difficulty urinating and hematuria.  Musculoskeletal: Negative for arthralgias, myalgias and neck pain.  Skin: Negative for rash.  Neurological: Negative for dizziness, seizures, syncope and headaches.  Hematological: Negative for adenopathy. Does not bruise/bleed easily.  Psychiatric/Behavioral: Negative for dysphoric mood. The patient is nervous/anxious.    Objective:    BP 120/76 (BP Location: Left Arm, Patient Position: Sitting, Cuff Size: Normal)   Pulse 68   Temp 98.2 F (36.8 C) (Oral)   Ht 5' 10.5" (1.791 m)   Wt 198 lb 3 oz (89.9 kg)   SpO2 97%   BMI 28.04 kg/m   Wt Readings from Last 3 Encounters:  04/18/19 198 lb 3 oz (89.9 kg)  03/24/18 198 lb (89.8 kg)  12/09/17 194 lb (88 kg)    Physical Exam Vitals signs and nursing note reviewed.  Constitutional:      General: He is not in acute distress.    Appearance: Normal appearance.  He is well-developed.  HENT:     Head: Normocephalic and atraumatic.     Right Ear: Hearing, tympanic membrane, ear canal and external ear normal.     Left Ear: Hearing, tympanic membrane, ear canal and external ear normal.     Nose: Nose normal.     Mouth/Throat:     Mouth: Mucous membranes are moist.     Pharynx: Uvula midline. No oropharyngeal exudate or posterior oropharyngeal erythema.  Eyes:     General: No scleral  icterus.    Conjunctiva/sclera: Conjunctivae normal.     Pupils: Pupils are equal, round, and reactive to light.  Neck:     Musculoskeletal: Normal range of motion and neck supple.  Cardiovascular:     Rate and Rhythm: Normal rate and regular rhythm.     Pulses: Normal pulses.          Radial pulses are 2+ on the right side and 2+ on the left side.     Heart sounds: Normal heart sounds. No murmur.  Pulmonary:     Effort: Pulmonary effort is normal. No respiratory distress.     Breath sounds: Normal breath sounds. No wheezing, rhonchi or rales.  Abdominal:     General: Bowel sounds are normal. There is no distension.     Palpations: Abdomen is soft. There is no mass.     Tenderness: There is no abdominal tenderness. There is no guarding or rebound.     Hernia: There is no hernia in the left inguinal area or right inguinal area.  Genitourinary:    Penis: Normal and circumcised.      Scrotum/Testes:        Right: Mass, tenderness or swelling not present. Right testis is descended.        Left: Tenderness present. Mass or swelling not present. Left testis is descended.     Epididymis:     Right: Normal. Not inflamed.     Left: Inflamed.     Comments: Tender to palpation at L epididymis > testicle without enlargement/mass/lumps appreciated Musculoskeletal: Normal range of motion.     Right lower leg: No edema.     Left lower leg: No edema.  Lymphadenopathy:     Cervical: No cervical adenopathy.     Lower Body: No right inguinal adenopathy. No left inguinal adenopathy.  Skin:    General: Skin is warm and dry.     Findings: No rash.  Neurological:     General: No focal deficit present.     Mental Status: He is alert and oriented to person, place, and time.     Comments: CN grossly intact, station and gait intact  Psychiatric:        Mood and Affect: Mood normal.        Behavior: Behavior normal.        Thought Content: Thought content normal.        Judgment: Judgment normal.        Results for orders placed or performed in visit on 04/12/19  Lipid panel  Result Value Ref Range   Cholesterol 181 0 - 200 mg/dL   Triglycerides 57.0 0.0 - 149.0 mg/dL   HDL 51.90 >39.00 mg/dL   VLDL 11.4 0.0 - 40.0 mg/dL   LDL Cholesterol 117 (H) 0 - 99 mg/dL   Total CHOL/HDL Ratio 3    NonHDL 128.68   Comprehensive metabolic panel  Result Value Ref Range   Sodium 140 135 - 145 mEq/L   Potassium  4.1 3.5 - 5.1 mEq/L   Chloride 106 96 - 112 mEq/L   CO2 28 19 - 32 mEq/L   Glucose, Bld 92 70 - 99 mg/dL   BUN 14 6 - 23 mg/dL   Creatinine, Ser 0.91 0.40 - 1.50 mg/dL   Total Bilirubin 0.5 0.2 - 1.2 mg/dL   Alkaline Phosphatase 45 39 - 117 U/L   AST 22 0 - 37 U/L   ALT 51 0 - 53 U/L   Total Protein 6.4 6.0 - 8.3 g/dL   Albumin 4.5 3.5 - 5.2 g/dL   Calcium 9.1 8.4 - 10.5 mg/dL   GFR 98.97 >60.00 mL/min   Assessment & Plan:   Problem List Items Addressed This Visit    Left testicular pain    Anticipate L epididymitis. Monogamous relationship. Check UA and STD (chlamydia/gonorrhea). Treat with bactrim DS 10d course.  Will check scrotal US r/o torsion.       Relevant Orders   US SCROTUM W/DOPPLER   C. trachomatis/N. gonorrhoeae RNA   Health maintenance examination - Primary    Preventative protocols reviewed and updated unless pt declined. Discussed healthy diet and lifestyle.       Anxiety state    Stable period off medication. Going on walks helps with anxiety.        Other Visit Diagnoses    Need for Tdap vaccination       Relevant Orders   Tdap vaccine greater than or equal to 7yo IM (Completed)       Meds ordered this encounter  Medications  . sulfamethoxazole-trimethoprim (BACTRIM DS) 800-160 MG tablet    Sig: Take 1 tablet by mouth 2 (two) times daily.    Dispense:  20 tablet    Refill:  0   Orders Placed This Encounter  Procedures  . C. trachomatis/N. gonorrhoeae RNA  . US SCROTUM W/DOPPLER    Standing Status:   Future    Number of Occurrences:    1    Standing Expiration Date:   06/17/2020    Order Specific Question:   Reason for Exam (SYMPTOM  OR DIAGNOSIS REQUIRED)    Answer:   L testicular pain eval epididymitis r/o torsion    Order Specific Question:   Preferred imaging location?    Answer:   [facility]  . Tdap vaccine greater than or equal to 7yo IM    Follow up plan: Return in about 1 year (around 04/17/2020) for annual exam, prior fasting for blood work.  Ria Bush, MD

## 2019-04-18 NOTE — Assessment & Plan Note (Signed)
Anticipate L epididymitis. Monogamous relationship. Check UA and STD (chlamydia/gonorrhea). Treat with bactrim DS 10d course.  Will check scrotal US r/o torsion.

## 2019-04-18 NOTE — Assessment & Plan Note (Signed)
Preventative protocols reviewed and updated unless pt declined. Discussed healthy diet and lifestyle.  

## 2019-04-18 NOTE — Patient Instructions (Addendum)
Tdap today.  Urine check today.  I think you do have epididymitis - treat with antibiotic sent to pharmacy. May use advil 400-600mg  with meals for next few days, elevate scrotum with prolonged sitting. Avoid tight underwear.  Let us know if not improving with treatment for ultrasound.   Health Maintenance, Male A healthy lifestyle and preventive care is important for your health and wellness. Ask your health care provider about what schedule of regular examinations is right for you. What should I know about weight and diet? Eat a Healthy Diet  Eat plenty of vegetables, fruits, whole grains, low-fat dairy products, and lean protein.  Do not eat a lot of foods high in solid fats, added sugars, or salt.  Maintain a Healthy Weight Regular exercise can help you achieve or maintain a healthy weight. You should:  Do at least 150 minutes of exercise each week. The exercise should increase your heart rate and make you sweat (moderate-intensity exercise).  Do strength-training exercises at least twice a week. Watch Your Levels of Cholesterol and Blood Lipids  Have your blood tested for lipids and cholesterol every 5 years starting at 28 years of age. If you are at high risk for heart disease, you should start having your blood tested when you are 28 years old. You may need to have your cholesterol levels checked more often if: ? Your lipid or cholesterol levels are high. ? You are older than 28 years of age. ? You are at high risk for heart disease. What should I know about cancer screening? Many types of cancers can be detected early and may often be prevented. Lung Cancer  You should be screened every year for lung cancer if: ? You are a current smoker who has smoked for at least 30 years. ? You are a former smoker who has quit within the past 15 years.  Talk to your health care provider about your screening options, when you should start screening, and how often you should be  screened. Colorectal Cancer  Routine colorectal cancer screening usually begins at 28 years of age and should be repeated every 5-10 years until you are 28 years old. You may need to be screened more often if early forms of precancerous polyps or small growths are found. Your health care provider may recommend screening at an earlier age if you have risk factors for colon cancer.  Your health care provider may recommend using home test kits to check for hidden blood in the stool.  A small camera at the end of a tube can be used to examine your colon (sigmoidoscopy or colonoscopy). This checks for the earliest forms of colorectal cancer. Prostate and Testicular Cancer  Depending on your age and overall health, your health care provider may do certain tests to screen for prostate and testicular cancer.  Talk to your health care provider about any symptoms or concerns you have about testicular or prostate cancer. Skin Cancer  Check your skin from head to toe regularly.  Tell your health care provider about any new moles or changes in moles, especially if: ? There is a change in a mole's size, shape, or color. ? You have a mole that is larger than a pencil eraser.  Always use sunscreen. Apply sunscreen liberally and repeat throughout the day.  Protect yourself by wearing long sleeves, pants, a wide-brimmed hat, and sunglasses when outside. What should I know about heart disease, diabetes, and high blood pressure?  If you are 18-39 years  of age, have your blood pressure checked every 3-5 years. If you are 88 years of age or older, have your blood pressure checked every year. You should have your blood pressure measured twice-once when you are at a hospital or clinic, and once when you are not at a hospital or clinic. Record the average of the two measurements. To check your blood pressure when you are not at a hospital or clinic, you can use: ? An automated blood pressure machine at a  pharmacy. ? A home blood pressure monitor.  Talk to your health care provider about your target blood pressure.  If you are between 21-63 years old, ask your health care provider if you should take aspirin to prevent heart disease.  Have regular diabetes screenings by checking your fasting blood sugar level. ? If you are at a normal weight and have a low risk for diabetes, have this test once every three years after the age of 31. ? If you are overweight and have a high risk for diabetes, consider being tested at a younger age or more often.  A one-time screening for abdominal aortic aneurysm (AAA) by ultrasound is recommended for men aged 59-75 years who are current or former smokers. What should I know about preventing infection? Hepatitis B If you have a higher risk for hepatitis B, you should be screened for this virus. Talk with your health care provider to find out if you are at risk for hepatitis B infection. Hepatitis C Blood testing is recommended for:  Everyone born from [birth year] through 1965.  Anyone with known risk factors for hepatitis C. Sexually Transmitted Diseases (STDs)  You should be screened each year for STDs including gonorrhea and chlamydia if: ? You are sexually active and are younger than 28 years of age. ? You are older than 28 years of age and your health care provider tells you that you are at risk for this type of infection. ? Your sexual activity has changed since you were last screened and you are at an increased risk for chlamydia or gonorrhea. Ask your health care provider if you are at risk.  Talk with your health care provider about whether you are at high risk of being infected with HIV. Your health care provider may recommend a prescription medicine to help prevent HIV infection. What else can I do?  Schedule regular health, dental, and eye exams.  Stay current with your vaccines (immunizations).  Do not use any tobacco products, such as cigarettes,  chewing tobacco, and e-cigarettes. If you need help quitting, ask your health care provider.  Limit alcohol intake to no more than 2 drinks per day. One drink equals 12 ounces of beer, 5 ounces of wine, or 1 ounces of hard liquor.  Do not use street drugs.  Do not share needles.  Ask your health care provider for help if you need support or information about quitting drugs.  Tell your health care provider if you often feel depressed.  Tell your health care provider if you have ever been abused or do not feel safe at home. This information is not intended to replace advice given to you by your health care provider. Make sure you discuss any questions you have with your health care provider. Document Released: 04/16/2008 Document Revised: 06/17/2016 Document Reviewed: 07/23/2015 Elsevier Interactive Patient Education  2019 [employer].

## 2019-04-19 ENCOUNTER — Encounter: Payer: BLUE CROSS/BLUE SHIELD | Admitting: Family Medicine

## 2019-04-19 LAB — C. TRACHOMATIS/N. GONORRHOEAE RNA
C. trachomatis RNA, TMA: NOT DETECTED
N. gonorrhoeae RNA, TMA: NOT DETECTED

## 2019-04-23 ENCOUNTER — Encounter: Payer: Self-pay | Admitting: Family Medicine

## 2019-08-02 DIAGNOSIS — D229 Melanocytic nevi, unspecified: Secondary | ICD-10-CM | POA: Diagnosis not present

## 2019-08-18 ENCOUNTER — Encounter: Payer: Self-pay | Admitting: Family Medicine

## 2019-08-22 ENCOUNTER — Encounter: Payer: Self-pay | Admitting: Family Medicine

## 2019-08-24 NOTE — Telephone Encounter (Signed)
Sent new letter to pt via MyChart and notified him via MyChart that it was sent.

## 2019-09-11 ENCOUNTER — Telehealth: Payer: Self-pay

## 2019-09-11 NOTE — Telephone Encounter (Signed)
Noted  

## 2019-09-11 NOTE — Telephone Encounter (Signed)
Pt said for one wk on and off pt has had chest tightness,SOB,and head congestion; lower back pain with no UTI symptoms. Pt had diarrhea 2 days ago.pt will have someone take pt to [facility] in [place] for eval.no travel and no known exposure. FYI to Dr Darnell Level.

## 2020-04-14 ENCOUNTER — Other Ambulatory Visit: Payer: Self-pay | Admitting: Family Medicine

## 2020-04-16 ENCOUNTER — Other Ambulatory Visit: Payer: Self-pay | Admitting: Family Medicine

## 2020-04-16 ENCOUNTER — Other Ambulatory Visit: Payer: Self-pay

## 2020-04-16 ENCOUNTER — Other Ambulatory Visit (INDEPENDENT_AMBULATORY_CARE_PROVIDER_SITE_OTHER): Payer: BC Managed Care – PPO

## 2020-04-16 DIAGNOSIS — Z0000 Encounter for general adult medical examination without abnormal findings: Secondary | ICD-10-CM

## 2020-04-16 LAB — BASIC METABOLIC PANEL
BUN: 14 mg/dL (ref 6–23)
CO2: 30 mEq/L (ref 19–32)
Calcium: 9.5 mg/dL (ref 8.4–10.5)
Chloride: 103 mEq/L (ref 96–112)
Creatinine, Ser: 1.02 mg/dL (ref 0.40–1.50)
GFR: 86.14 mL/min (ref >60.00)
Glucose, Bld: 97 mg/dL (ref 70–99)
Potassium: 4.4 mEq/L (ref 3.5–5.1)
Sodium: 138 mEq/L (ref 135–145)

## 2020-04-16 LAB — LIPID PANEL
Cholesterol: 192 mg/dL (ref 0–200)
HDL: 53.9 mg/dL (ref >39.00)
LDL Cholesterol: 120 mg/dL — ABNORMAL HIGH (ref 0–99)
NonHDL: 138.3
Total CHOL/HDL Ratio: 4
Triglycerides: 90 mg/dL (ref 0.0–149.0)
VLDL: 18 mg/dL (ref 0.0–40.0)

## 2020-04-23 ENCOUNTER — Encounter: Payer: BLUE CROSS/BLUE SHIELD | Admitting: Family Medicine

## 2020-04-23 ENCOUNTER — Telehealth: Payer: Self-pay

## 2020-04-23 NOTE — Telephone Encounter (Signed)
Per appt notes appt has already been changed.

## 2020-04-23 NOTE — Telephone Encounter (Signed)
[place] Night - Client Nonclinical Telephone Record AccessNurse Client [facility] Night - Client Client Site [place] Physician Ria Bush - MD Contact Type Call Who Is Calling Patient / Member / Family / Caregiver Caller Name [name] Caller Phone Number [phone] Patient Name Jose Estes Patient DOB 1991-08-05 Call Type Message Only Information Provided Reason for Call Request to Reschedule Office Appointment Initial Comment Caller states he has an appointment today 06/22/2021at 9:30am. The caller has a work issue and may not be able to make 9:30am appointment. 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 Disp. Time Disposition Final User 04/23/2020 8:05:12 AM General Information Provided Yes King-Hussey, Berdi Call Closed By: [name] Transaction Date/Time: 04/23/2020 7:57:01 AM (ET)

## 2020-04-24 ENCOUNTER — Encounter: Payer: Self-pay | Admitting: Family Medicine

## 2020-04-24 ENCOUNTER — Other Ambulatory Visit: Payer: Self-pay

## 2020-04-24 ENCOUNTER — Ambulatory Visit (INDEPENDENT_AMBULATORY_CARE_PROVIDER_SITE_OTHER): Payer: BC Managed Care – PPO | Admitting: Family Medicine

## 2020-04-24 VITALS — BP 118/78 | HR 76 | Temp 97.8°F | Ht 71.0 in | Wt 187.0 lb

## 2020-04-24 DIAGNOSIS — Z0000 Encounter for general adult medical examination without abnormal findings: Secondary | ICD-10-CM | POA: Diagnosis not present

## 2020-04-24 DIAGNOSIS — F411 Generalized anxiety disorder: Secondary | ICD-10-CM

## 2020-04-24 NOTE — Patient Instructions (Addendum)
You are doing well today.  For inattention - consider trial of fish oil daily 1-2 capsules.  Pick up multivitamin to see if helpful as well.  Return as needed or in 1-2 years for next physical   Health Maintenance, Male Adopting a healthy lifestyle and getting preventive care are important in promoting health and wellness. Ask your health care provider about:  The right schedule for you to have regular tests and exams.  Things you can do on your own to prevent diseases and keep yourself healthy. What should I know about diet, weight, and exercise? Eat a healthy diet   Eat a diet that includes plenty of vegetables, fruits, low-fat dairy products, and lean protein.  Do not eat a lot of foods that are high in solid fats, added sugars, or sodium. Maintain a healthy weight Body mass index (BMI) is a measurement that can be used to identify possible weight problems. It estimates body fat based on height and weight. Your health care provider can help determine your BMI and help you achieve or maintain a healthy weight. Get regular exercise Get regular exercise. This is one of the most important things you can do for your health. Most adults should:  Exercise for at least 150 minutes each week. The exercise should increase your heart rate and make you sweat (moderate-intensity exercise).  Do strengthening exercises at least twice a week. This is in addition to the moderate-intensity exercise.  Spend less time sitting. Even light physical activity can be beneficial. Watch cholesterol and blood lipids Have your blood tested for lipids and cholesterol at 30 years of age, then have this test every 5 years. You may need to have your cholesterol levels checked more often if:  Your lipid or cholesterol levels are high.  You are older than 29 years of age.  You are at high risk for heart disease. What should I know about cancer screening? Many types of cancers can be detected early and may  often be prevented. Depending on your health history and family history, you may need to have cancer screening at various ages. This may include screening for:  Colorectal cancer.  Prostate cancer.  Skin cancer.  Lung cancer. What should I know about heart disease, diabetes, and high blood pressure? Blood pressure and heart disease  High blood pressure causes heart disease and increases the risk of stroke. This is more likely to develop in people who have high blood pressure readings, are of African descent, or are overweight.  Talk with your health care provider about your target blood pressure readings.  Have your blood pressure checked: ? Every 3-5 years if you are 87-39 years of age. ? Every year if you are 73 years old or older.  If you are between the ages of 48 and 43 and are a current or former smoker, ask your health care provider if you should have a one-time screening for abdominal aortic aneurysm (AAA). Diabetes Have regular diabetes screenings. This checks your fasting blood sugar level. Have the screening done:  Once every three years after age 16 if you are at a normal weight and have a low risk for diabetes.  More often and at a younger age if you are overweight or have a high risk for diabetes. What should I know about preventing infection? Hepatitis B If you have a higher risk for hepatitis B, you should be screened for this virus. Talk with your health care provider to find out if you are  at risk for hepatitis B infection. Hepatitis C Blood testing is recommended for:  Everyone born from [birth year] through 1965.  Anyone with known risk factors for hepatitis C. Sexually transmitted infections (STIs)  You should be screened each year for STIs, including gonorrhea and chlamydia, if: ? You are sexually active and are younger than 29 years of age. ? You are older than 29 years of age and your health care provider tells you that you are at risk for this type of  infection. ? Your sexual activity has changed since you were last screened, and you are at increased risk for chlamydia or gonorrhea. Ask your health care provider if you are at risk.  Ask your health care provider about whether you are at high risk for HIV. Your health care provider may recommend a prescription medicine to help prevent HIV infection. If you choose to take medicine to prevent HIV, you should first get tested for HIV. You should then be tested every 3 months for as long as you are taking the medicine. Follow these instructions at home: Lifestyle  Do not use any products that contain nicotine or tobacco, such as cigarettes, e-cigarettes, and chewing tobacco. If you need help quitting, ask your health care provider.  Do not use street drugs.  Do not share needles.  Ask your health care provider for help if you need support or information about quitting drugs. Alcohol use  Do not drink alcohol if your health care provider tells you not to drink.  If you drink alcohol: ? Limit how much you have to 0-2 drinks a day. ? Be aware of how much alcohol is in your drink. In the [place] one drink equals one 12 oz bottle of beer (355 mL), one 5 oz glass of wine (148 mL), or one 1 oz glass of hard liquor (44 mL). General instructions  Schedule regular health, dental, and eye exams.  Stay current with your vaccines.  Tell your health care provider if: ? You often feel depressed. ? You have ever been abused or do not feel safe at home. Summary  Adopting a healthy lifestyle and getting preventive care are important in promoting health and wellness.  Follow your health care provider's instructions about healthy diet, exercising, and getting tested or screened for diseases.  Follow your health care provider's instructions on monitoring your cholesterol and blood pressure. This information is not intended to replace advice given to you by your health care provider. Make sure you  discuss any questions you have with your health care provider. Document Revised: 10/12/2018 Document Reviewed: 10/12/2018 Elsevier Patient Education  2020 [employer].

## 2020-04-24 NOTE — Progress Notes (Signed)
This visit was conducted in person.  BP 118/78   Pulse 76   Temp 97.8 F (36.6 C) (Temporal)   Ht 5\' 11"  (1.803 m)   Wt 187 lb (84.8 kg)   SpO2 97%   BMI 26.08 kg/m    CC: CPE Subjective:    Patient ID: Jose Estes, male    DOB: 02/14/1991, 29 y.o.   MRN: [id number]  HPI: Jose Estes is a 29 y.o. male presenting on 04/24/2020 for Annual Exam   Anxiety - stable period off medication. Has been able to work from home. Busy at work. Wonders about ADHD. Notes some trouble with focus. No fatigue.   Started intermittent fasting as well as healthier diet - more fruits/vegetables. 10+ lb weight loss with healthy changes.   Monogamous relationship.   Preventative: Flu shot - declines Tdap 04/2019 COVID vaccine - considering  Seat belt use discussed.  Sunscreen use discussed. Saw dermatology 07/2019 for skin check - reassuring eval.  Ex smoker - quit E cigs 01/2019  Alcohol - seldom  Rec drugs - denies  Dentist q6 mo  Eye exam - due - wears blue light glasses - high screen time. 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  Diet: good water, fruits/vegetables daily, avoiding sodas  Caffeine: 1.5 cups coffee/day     Relevant past medical, surgical, family and social history reviewed and updated as indicated. Interim medical history since our last visit reviewed. Allergies and medications reviewed and updated. Outpatient Medications Prior to Visit  Medication Sig Dispense Refill  . Ascorbic Acid (VITAMIN C PO) Take by mouth as needed.    Marland Kitchen BIOTIN PO Take by mouth as needed.    . sulfamethoxazole-trimethoprim (BACTRIM DS) 800-160 MG tablet Take 1 tablet by mouth 2 (two) times daily. 20 tablet 0   No facility-administered medications prior to visit.     Per HPI unless specifically indicated in ROS section below Review of  Systems  Constitutional: Negative for activity change, appetite change, chills, fatigue, fever and unexpected weight change.  HENT: Negative for hearing loss.   Eyes: Negative for visual disturbance.  Respiratory: Negative for cough, chest tightness, shortness of breath and wheezing.   Cardiovascular: Negative for chest pain, palpitations and leg swelling.  Gastrointestinal: Negative for abdominal distention, abdominal pain, blood in stool, constipation, diarrhea, nausea and vomiting.  Genitourinary: Negative for difficulty urinating and hematuria.  Musculoskeletal: Negative for arthralgias, myalgias and neck pain.  Skin: Negative for rash.  Neurological: Positive for dizziness (some lightheadedness last week) and headaches (eye strain). Negative for seizures and syncope.  Hematological: Negative for adenopathy. Does not bruise/bleed easily.  Psychiatric/Behavioral: Negative for dysphoric mood. The patient is not nervous/anxious.    Objective:  BP 118/78   Pulse 76   Temp 97.8 F (36.6 C) (Temporal)   Ht 5\' 11"  (1.803 m)   Wt 187 lb (84.8 kg)   SpO2 97%   BMI 26.08 kg/m   Wt Readings from Last 3 Encounters:  04/24/20 187 lb (84.8 kg)  04/18/19 198 lb 3 oz (89.9 kg)  03/24/18 198 lb (89.8 kg)      Physical Exam Vitals and nursing note reviewed.  Constitutional:      General: He is not in acute distress.    Appearance: Normal appearance. He is well-developed. He is not ill-appearing.  HENT:     Head: Normocephalic and  atraumatic.     Right Ear: Hearing, tympanic membrane, ear canal and external ear normal.     Left Ear: Hearing, tympanic membrane, ear canal and external ear normal.  Eyes:     General: No scleral icterus.    Extraocular Movements: Extraocular movements intact.     Conjunctiva/sclera: Conjunctivae normal.     Pupils: Pupils are equal, round, and reactive to light.  Cardiovascular:     Rate and Rhythm: Normal rate and regular rhythm.     Pulses: Normal  pulses.          Radial pulses are 2+ on the right side and 2+ on the left side.     Heart sounds: Normal heart sounds. No murmur heard.   Pulmonary:     Effort: Pulmonary effort is normal. No respiratory distress.     Breath sounds: Normal breath sounds. No wheezing, rhonchi or rales.  Abdominal:     General: Abdomen is flat. Bowel sounds are normal. There is no distension.     Palpations: Abdomen is soft. There is no mass.     Tenderness: There is no abdominal tenderness. There is no guarding or rebound.     Hernia: No hernia is present.  Musculoskeletal:        General: Normal range of motion.     Cervical back: Normal range of motion and neck supple.     Right lower leg: No edema.     Left lower leg: No edema.  Lymphadenopathy:     Cervical: No cervical adenopathy.  Skin:    General: Skin is warm and dry.     Findings: No rash.     Comments: Benign appearing raised moles on upper back  Neurological:     General: No focal deficit present.     Mental Status: He is alert and oriented to person, place, and time.     Comments: CN grossly intact, station and gait intact  Psychiatric:        Mood and Affect: Mood normal.        Behavior: Behavior normal.        Thought Content: Thought content normal.        Judgment: Judgment normal.       Results for orders placed or performed in visit on 41/96/22  Basic metabolic panel  Result Value Ref Range   Sodium 138 135 - 145 mEq/L   Potassium 4.4 3.5 - 5.1 mEq/L   Chloride 103 96 - 112 mEq/L   CO2 30 19 - 32 mEq/L   Glucose, Bld 97 70 - 99 mg/dL   BUN 14 6 - 23 mg/dL   Creatinine, Ser 1.02 0.40 - 1.50 mg/dL   GFR 86.14 >60.00 mL/min   Calcium 9.5 8.4 - 10.5 mg/dL  Lipid panel  Result Value Ref Range   Cholesterol 192 0 - 200 mg/dL   Triglycerides 90.0 0 - 149 mg/dL   HDL 53.90 >39.00 mg/dL   VLDL 18.0 0.0 - 40.0 mg/dL   LDL Cholesterol 120 (H) 0 - 99 mg/dL   Total CHOL/HDL Ratio 4    NonHDL 138.30    Depression screen  [facility] 2/9 04/24/2020 04/18/2019 12/09/2017 12/01/2017 05/25/2016  Decreased Interest 0 1 2 1 1   Down, Depressed, Hopeless 0 1 1 0 1  PHQ - 2 Score 0 2 3 1 2   Altered sleeping 0 0 2 - 1  Tired, decreased energy 1 2 1  - 2  Change in appetite 0 1 2 -  0  Feeling bad or failure about yourself  0 1 1 - 1  Trouble concentrating 1 1 1  - 0  Moving slowly or fidgety/restless 2 2 1  - 0  Suicidal thoughts 0 0 0 - 0  PHQ-9 Score 4 9 11  - 6    GAD 7 : Generalized Anxiety Score 04/18/2019 12/09/2017 05/25/2016  Nervous, Anxious, on Edge 2 1 1   Control/stop worrying 1 1 1   Worry too much - different things 2 2 1   Trouble relaxing 1 1 0  Restless 2 1 1   Easily annoyed or irritable 2 2 0  Afraid - awful might happen 2 0 1  Total GAD 7 Score 12 8 5    Assessment & Plan:  This visit occurred during the SARS-CoV-2 public health emergency.  Safety protocols were in place, including screening questions prior to the visit, additional usage of staff PPE, and extensive cleaning of exam room while observing appropriate contact time as indicated for disinfecting solutions.   Problem List Items Addressed This Visit    Health maintenance examination - Primary    Preventative protocols reviewed and updated unless pt declined. Discussed healthy diet and lifestyle.       Anxiety state    Stable period off medication. Discussed possible inattention - he will try fish oil supplement. Reviewed importance of healthy stress relieving strategies.           No orders of the defined types were placed in this encounter.  No orders of the defined types were placed in this encounter.   Patient instructions: You are doing well today.  For inattention - consider trial of fish oil daily 1-2 capsules.  Pick up multivitamin to see if helpful as well.  Return as needed or in 1-2 years for next physical   Follow up plan: Return in about 1 year (around 04/24/2021).  Ria Bush, MD

## 2020-04-24 NOTE — Assessment & Plan Note (Signed)
Stable period off medication. Discussed possible inattention - he will try fish oil supplement. Reviewed importance of healthy stress relieving strategies.

## 2020-04-24 NOTE — Assessment & Plan Note (Signed)
Preventative protocols reviewed and updated unless pt declined. Discussed healthy diet and lifestyle.  

## 2020-05-22 DIAGNOSIS — Z03818 Encounter for observation for suspected exposure to other biological agents ruled out: Secondary | ICD-10-CM | POA: Diagnosis not present

## 2020-05-22 DIAGNOSIS — Z20822 Contact with and (suspected) exposure to covid-19: Secondary | ICD-10-CM | POA: Diagnosis not present

## 2020-05-23 ENCOUNTER — Other Ambulatory Visit: Payer: Self-pay

## 2021-02-11 ENCOUNTER — Other Ambulatory Visit: Payer: Self-pay | Admitting: Family Medicine

## 2021-02-11 ENCOUNTER — Other Ambulatory Visit (INDEPENDENT_AMBULATORY_CARE_PROVIDER_SITE_OTHER): Payer: Self-pay

## 2021-02-11 ENCOUNTER — Other Ambulatory Visit: Payer: Self-pay

## 2021-02-11 DIAGNOSIS — Z131 Encounter for screening for diabetes mellitus: Secondary | ICD-10-CM

## 2021-02-11 DIAGNOSIS — Z1159 Encounter for screening for other viral diseases: Secondary | ICD-10-CM

## 2021-02-11 DIAGNOSIS — Z13220 Encounter for screening for lipoid disorders: Secondary | ICD-10-CM

## 2021-02-11 LAB — BASIC METABOLIC PANEL
BUN: 12 mg/dL (ref 6–23)
CO2: 27 mEq/L (ref 19–32)
Calcium: 9.2 mg/dL (ref 8.4–10.5)
Chloride: 103 mEq/L (ref 96–112)
Creatinine, Ser: 1.1 mg/dL (ref 0.40–1.50)
GFR: 90.3 mL/min (ref >60.00)
Glucose, Bld: 94 mg/dL (ref 70–99)
Potassium: 4.1 mEq/L (ref 3.5–5.1)
Sodium: 138 mEq/L (ref 135–145)

## 2021-02-11 LAB — LIPID PANEL
Cholesterol: 184 mg/dL (ref 0–200)
HDL: 67.8 mg/dL (ref >39.00)
LDL Cholesterol: 103 mg/dL — ABNORMAL HIGH (ref 0–99)
NonHDL: 115.87
Total CHOL/HDL Ratio: 3
Triglycerides: 62 mg/dL (ref 0.0–149.0)
VLDL: 12.4 mg/dL (ref 0.0–40.0)

## 2021-02-12 LAB — HEPATITIS C ANTIBODY
Hepatitis C Ab: NONREACTIVE
SIGNAL TO CUT-OFF: 0.01 (ref <1.00)

## 2021-02-18 ENCOUNTER — Telehealth: Payer: Self-pay | Admitting: Family Medicine

## 2021-02-18 ENCOUNTER — Encounter: Payer: Self-pay | Admitting: Family Medicine

## 2021-02-18 NOTE — Progress Notes (Signed)
Patient ID: Jose Estes, male    DOB: Feb 03, 1991, 30 y.o.   MRN: [id number]  Virtual visit completed through [place], a video enabled telemedicine application. Due to national recommendations of social distancing due to COVID-19, a virtual visit is felt to be most appropriate for this patient at this time. Reviewed limitations, risks, security and privacy concerns of performing a virtual visit and the availability of in person appointments. I also reviewed that there may be a patient responsible charge related to this service. The patient agreed to proceed.   Patient location: home Provider location: [place] at [facility], office Persons participating in this virtual visit: patient, provider   If any vitals were documented, they were collected by patient at home unless specified below.    Pulse 68   Ht 5\' 11"  (1.803 m)   Wt 195 lb (88.5 kg)   SpO2 97%   BMI 27.20 kg/m    CC: CPE Subjective:   HPI: Jose Estes is a 30 y.o. male presenting on 02/18/2021 for Annual Exam   Physical converted to virtual visit given nasal congestion likely due to allergies.  Currently between insurances - will reschedule CPE to when he gets insurance.      Relevant past medical, surgical, family and social history reviewed and updated as indicated. Interim medical history since our last visit reviewed. Allergies and medications reviewed and updated. Outpatient Medications Prior to Visit  Medication Sig Dispense Refill  . Ascorbic Acid (VITAMIN C PO) Take by mouth as needed.    Marland Kitchen BIOTIN PO Take by mouth as needed.     No facility-administered medications prior to visit.     Per HPI unless specifically indicated in ROS section below Review of Systems Objective:  Pulse 68   Ht 5\' 11"  (1.803 m)   Wt 195 lb (88.5 kg)   SpO2 97%   BMI 27.20 kg/m   Wt Readings from Last 3 Encounters:  02/18/21 195 lb (88.5 kg)  04/24/20 187 lb (84.8 kg)  04/18/19 198 lb 3 oz (89.9 kg)             Results for orders placed or performed in visit on 88/91/69  Basic metabolic panel  Result Value Ref Range   Sodium 138 135 - 145 mEq/L   Potassium 4.1 3.5 - 5.1 mEq/L   Chloride 103 96 - 112 mEq/L   CO2 27 19 - 32 mEq/L   Glucose, Bld 94 70 - 99 mg/dL   BUN 12 6 - 23 mg/dL   Creatinine, Ser 1.10 0.40 - 1.50 mg/dL   GFR 90.30 >60.00 mL/min   Calcium 9.2 8.4 - 10.5 mg/dL  Lipid panel  Result Value Ref Range   Cholesterol 184 0 - 200 mg/dL   Triglycerides 62.0 0.0 - 149.0 mg/dL   HDL 67.80 >39.00 mg/dL   VLDL 12.4 0.0 - 40.0 mg/dL   LDL Cholesterol 103 (H) 0 - 99 mg/dL   Total CHOL/HDL Ratio 3    NonHDL 115.87   Hepatitis C antibody  Result Value Ref Range   Hepatitis C Ab NON-REACTIVE NON-REACTI   SIGNAL TO CUT-OFF 0.01 <1.00   Assessment & Plan:   Problem List Items Addressed This Visit   None   Visit Diagnoses    Erroneous encounter - disregard    -  Primary       No orders of the defined types were placed in this encounter.  No orders of the defined types were placed in this  encounter.   Follow up plan: No follow-ups on file.  Ria Bush, MD

## 2021-03-12 ENCOUNTER — Telehealth (INDEPENDENT_AMBULATORY_CARE_PROVIDER_SITE_OTHER): Payer: Self-pay | Admitting: Family Medicine

## 2021-03-12 ENCOUNTER — Encounter: Payer: Self-pay | Admitting: Family Medicine

## 2021-03-12 DIAGNOSIS — J988 Other specified respiratory disorders: Secondary | ICD-10-CM

## 2021-03-12 MED ORDER — PREDNISONE 5 MG/5ML PO SOLN: ORAL | 0 refills | Status: AC

## 2021-03-12 MED ORDER — AZITHROMYCIN 200 MG/5ML PO SUSR
ORAL | 0 refills | Status: AC
Start: 2021-03-12 — End: 2021-03-17

## 2021-03-12 NOTE — Progress Notes (Signed)
Patient ID: Jose Estes, male    DOB: 05/08/1991, 30 y.o.   MRN: [id number]  Virtual visit completed through [place], a video enabled telemedicine application. Due to national recommendations of social distancing due to COVID-19, a virtual visit is felt to be most appropriate for this patient at this time. Reviewed limitations, risks, security and privacy concerns of performing a virtual visit and the availability of in person appointments. I also reviewed that there may be a patient responsible charge related to this service. The patient agreed to proceed.   Patient location: home Provider location: [place] at [facility], office Persons participating in this virtual visit: patient, provider   If any vitals were documented, they were collected by patient at home unless specified below.    Pulse 72   Temp 97.8 F (36.6 C)   Ht 5\' 11"  (1.803 m)   Wt 192 lb (87.1 kg)   SpO2 97%   BMI 26.78 kg/m    CC: cough, congestion, fever Subjective:   HPI: Shaquelle Hernon is a 30 y.o. male presenting on 03/12/2021 for Cough (C/o prod cough, chest congestion and nasal congestion.  Cough worse at night.  Sxs started about 1 wk ago.  Also, had fever, max 100.0, body aches and sore throat.  Took Nyquil and these sxs have since resolved. )   10+d h/o PNdrainage, sinus congestion, ST, body aches, chills, fever Tmax 100. On Thursday started feeling sinus congestion, chest congestion, coughing fits worse at night time. Overall feeling well during the day, but mild cough persists during the day. Chest > head congestion.   No sinus or facial pain, no chest pressure or tightness or wheezing.   Managing symptoms with ibuprofen, nyquil, mucinex. Used humidifier last night. Has tried robitussin without much benefit. Good vitamin C.  Feels he's staying well hydrated.  Non smoker  No h/o asthma.  No sick contacts at home.  He did take COVID test x3 - all negative.   Has not received COVID vaccine.       Relevant past medical, surgical, family and social history reviewed and updated as indicated. Interim medical history since our last visit reviewed. Allergies and medications reviewed and updated. Outpatient Medications Prior to Visit  Medication Sig Dispense Refill  . Ascorbic Acid (VITAMIN C PO) Take by mouth as needed.    Marland Kitchen BIOTIN PO Take by mouth as needed.     No facility-administered medications prior to visit.     Per HPI unless specifically indicated in ROS section below Review of Systems Objective:  Pulse 72   Temp 97.8 F (36.6 C)   Ht 5\' 11"  (1.803 m)   Wt 192 lb (87.1 kg)   SpO2 97%   BMI 26.78 kg/m   Wt Readings from Last 3 Encounters:  03/12/21 192 lb (87.1 kg)  02/18/21 195 lb (88.5 kg)  04/24/20 187 lb (84.8 kg)       Physical exam: Gen: alert, NAD, not ill appearing Pulm: speaks in complete sentences without increased work of breathing, deep cough intermittently present during visit Psych: normal mood, normal thought content      Assessment & Plan:   Problem List Items Addressed This Visit    Respiratory infection    Anticipate acute viral bronchitis. He has had negative covid tests x3. Supportive care reviewed. Rx prednisone taper. WASP for azithromycin with indications when to take. Rx solution forms of both medications due to longstanding trouble swallowing pills. Update if not improving with treatment.  Relevant Medications   azithromycin (ZITHROMAX) 200 MG/5ML suspension       Meds ordered this encounter  Medications  . predniSONE 5 MG/5ML solution    Sig: Take 30 mLs (30 mg total) by mouth daily with breakfast for 2 days, THEN 20 mLs (20 mg total) daily with breakfast for 2 days, THEN 10 mLs (10 mg total) daily with breakfast for 2 days.    Dispense:  120 mL    Refill:  0  . azithromycin (ZITHROMAX) 200 MG/5ML suspension    Sig: Take 12.5 mLs (500 mg total) by mouth daily for 1 day, THEN 6.3 mLs (250 mg total) daily for 4 days.     Dispense:  37.7 mL    Refill:  0   No orders of the defined types were placed in this encounter.   I discussed the assessment and treatment plan with the patient. The patient was provided an opportunity to ask questions and all were answered. The patient agreed with the plan and demonstrated an understanding of the instructions. The patient was advised to call back or seek an in-person evaluation if the symptoms worsen or if the condition fails to improve as anticipated.  Follow up plan: No follow-ups on file.  Ria Bush, MD

## 2021-03-12 NOTE — Assessment & Plan Note (Signed)
Anticipate acute viral bronchitis. He has had negative covid tests x3. Supportive care reviewed. Rx prednisone taper. WASP for azithromycin with indications when to take. Rx solution forms of both medications due to longstanding trouble swallowing pills. Update if not improving with treatment.

## 2021-04-28 ENCOUNTER — Ambulatory Visit (INDEPENDENT_AMBULATORY_CARE_PROVIDER_SITE_OTHER): Payer: BLUE CROSS/BLUE SHIELD | Admitting: Family Medicine

## 2021-04-28 ENCOUNTER — Encounter: Payer: Self-pay | Admitting: Family Medicine

## 2021-04-28 ENCOUNTER — Other Ambulatory Visit: Payer: Self-pay

## 2021-04-28 VITALS — BP 124/72 | HR 66 | Temp 98.0°F | Ht 71.0 in | Wt 194.0 lb

## 2021-04-28 DIAGNOSIS — D229 Melanocytic nevi, unspecified: Secondary | ICD-10-CM | POA: Diagnosis not present

## 2021-04-28 DIAGNOSIS — F411 Generalized anxiety disorder: Secondary | ICD-10-CM

## 2021-04-28 DIAGNOSIS — Z0000 Encounter for general adult medical examination without abnormal findings: Secondary | ICD-10-CM

## 2021-04-28 DIAGNOSIS — R198 Other specified symptoms and signs involving the digestive system and abdomen: Secondary | ICD-10-CM | POA: Insufficient documentation

## 2021-04-28 NOTE — Assessment & Plan Note (Signed)
Preventative protocols reviewed and updated unless pt declined. Discussed healthy diet and lifestyle.  

## 2021-04-28 NOTE — Assessment & Plan Note (Addendum)
Stable period off medication.  Encouraged ongoing healthy stress relieving strategies.  He will let me know if symptoms becoming overwhelming/unmanageable.

## 2021-04-28 NOTE — Progress Notes (Signed)
Patient ID: Jose Estes, male    DOB: 07-14-1991, 30 y.o.   MRN: [id number]  This visit was conducted in person.  BP 124/72   Pulse 66   Temp 98 F (36.7 C) (Temporal)   Ht 5\' 11"  (1.803 m)   Wt 194 lb (88 kg)   SpO2 97%   BMI 27.06 kg/m    CC: CPE Subjective:   HPI: Jose Estes is a 30 y.o. male presenting on 04/28/2021 for Annual Exam   Anxiety - stable period off medication. continues working from home. Trouble focusing on work from home. Wonders about ADHD. Has previously tried lorazepam for travel.   COVID infection 10/2020.  Monogamous relationship.    Preventative: Flu shot - declines Tdap 04/2019 COVID vaccine - declined  Seat belt use discussed.  Sunscreen use discussed. Wants moles checked  Ex smoker - fully quit E cigs 01/2019  Alcohol - 1-2 drinks/night (mixed) Rec drugs - none Dentist q6 mo Eye exam - due - wears blue light glasses - high screen time. 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 Interim medical history since our last visit reviewed. Allergies and medications reviewed and updated. Outpatient Medications Prior to Visit  Medication Sig Dispense Refill   Ascorbic Acid (VITAMIN C PO) Take by mouth as needed.     BIOTIN PO Take by mouth as needed.     No facility-administered medications prior to visit.     Per HPI unless specifically indicated in ROS section below Review of Systems  Constitutional:  Negative for activity change, appetite change, chills, fatigue, fever and unexpected weight change.  HENT:  Negative for hearing loss.   Eyes:  Negative for visual disturbance.  Respiratory:  Negative for  cough, chest tightness, shortness of breath and wheezing.   Cardiovascular:  Negative for chest pain, palpitations and leg swelling.  Gastrointestinal:  Negative for abdominal distention, abdominal pain, blood in stool, constipation, diarrhea, nausea and vomiting.  Genitourinary:  Negative for difficulty urinating and hematuria.  Musculoskeletal:  Negative for arthralgias, myalgias and neck pain.  Skin:  Negative for rash.  Neurological:  Positive for headaches (mild). Negative for dizziness, seizures and syncope.  Hematological:  Negative for adenopathy. Does not bruise/bleed easily.  Psychiatric/Behavioral:  Negative for dysphoric mood. The patient is nervous/anxious.    Objective:  BP 124/72   Pulse 66   Temp 98 F (36.7 C) (Temporal)   Ht 5\' 11"  (1.803 m)   Wt 194 lb (88 kg)   SpO2 97%   BMI 27.06 kg/m   Wt Readings from Last 3 Encounters:  04/28/21 194 lb (88 kg)  03/12/21 192 lb (87.1 kg)  02/18/21 195 lb (88.5 kg)      Physical Exam Vitals and nursing note reviewed.  Constitutional:      General: He is not in acute distress.    Appearance: Normal appearance. He is well-developed. He is not ill-appearing.  HENT:     Head: Normocephalic and atraumatic.     Right Ear: Hearing, tympanic membrane, ear canal and external ear normal.     Left Ear: Hearing, tympanic membrane, ear canal and external ear normal.  Eyes:  General: No scleral icterus.    Extraocular Movements: Extraocular movements intact.     Conjunctiva/sclera: Conjunctivae normal.     Pupils: Pupils are equal, round, and reactive to light.  Neck:     Thyroid: No thyroid mass or thyromegaly.  Cardiovascular:     Rate and Rhythm: Normal rate and regular rhythm.     Pulses: Normal pulses.          Radial pulses are 2+ on the right side and 2+ on the left side.     Heart sounds: Normal heart sounds. No murmur heard. Pulmonary:     Effort: Pulmonary effort is normal. No respiratory distress.     Breath  sounds: Normal breath sounds. No wheezing, rhonchi or rales.  Abdominal:     General: Bowel sounds are normal. There is no distension.     Palpations: Abdomen is soft. There is no mass.     Tenderness: There is no abdominal tenderness. There is no guarding or rebound.     Hernia: No hernia is present.  Musculoskeletal:        General: Normal range of motion.     Cervical back: Normal range of motion and neck supple.     Right lower leg: No edema.     Left lower leg: No edema.  Lymphadenopathy:     Cervical: No cervical adenopathy.  Skin:    General: Skin is warm and dry.     Findings: No rash.  Neurological:     General: No focal deficit present.     Mental Status: He is alert and oriented to person, place, and time.  Psychiatric:        Mood and Affect: Mood normal.        Behavior: Behavior normal.        Thought Content: Thought content normal.        Judgment: Judgment normal.      Results for orders placed or performed in visit on 02/63/78  Basic metabolic panel  Result Value Ref Range   Sodium 138 135 - 145 mEq/L   Potassium 4.1 3.5 - 5.1 mEq/L   Chloride 103 96 - 112 mEq/L   CO2 27 19 - 32 mEq/L   Glucose, Bld 94 70 - 99 mg/dL   BUN 12 6 - 23 mg/dL   Creatinine, Ser 1.10 0.40 - 1.50 mg/dL   GFR 90.30 >60.00 mL/min   Calcium 9.2 8.4 - 10.5 mg/dL  Lipid panel  Result Value Ref Range   Cholesterol 184 0 - 200 mg/dL   Triglycerides 62.0 0.0 - 149.0 mg/dL   HDL 67.80 >39.00 mg/dL   VLDL 12.4 0.0 - 40.0 mg/dL   LDL Cholesterol 103 (H) 0 - 99 mg/dL   Total CHOL/HDL Ratio 3    NonHDL 115.87   Hepatitis C antibody  Result Value Ref Range   Hepatitis C Ab NON-REACTIVE NON-REACTI   SIGNAL TO CUT-OFF 0.01 <1.00   Depression screen [facility] 2/9 04/28/2021 02/18/2021 04/24/2020 04/18/2019 12/09/2017  Decreased Interest 1 0 0 1 2  Down, Depressed, Hopeless 1 0 0 1 1  PHQ - 2 Score 2 0 0 2 3  Altered sleeping 0 0 0 0 2  Tired, decreased energy 2 1 1 2 1   Change in appetite 2 0 0  1 2  Feeling bad or failure about yourself  1 0 0 1 1  Trouble concentrating 3 3 1 1 1   Moving slowly or fidgety/restless 2 0 2 2  1  Suicidal thoughts 0 0 0 0 0  PHQ-9 Score 12 4 4 9 11     GAD 7 : Generalized Anxiety Score 04/28/2021 02/18/2021 04/24/2020 04/18/2019  Nervous, Anxious, on Edge 3 1 2 2   Control/stop worrying 2 1 2 1   Worry too much - different things 2 0 2 2  Trouble relaxing 2 0 1 1  Restless 3 2 2 2   Easily annoyed or irritable 2 1 2 2   Afraid - awful might happen 1 1 1 2   Total GAD 7 Score 15 6 12 12    Assessment & Plan:  This visit occurred during the SARS-CoV-2 public health emergency.  Safety protocols were in place, including screening questions prior to the visit, additional usage of staff PPE, and extensive cleaning of exam room while observing appropriate contact time as indicated for disinfecting solutions.   Problem List Items Addressed This Visit     Health maintenance examination - Primary    Preventative protocols reviewed and updated unless pt declined. Discussed healthy diet and lifestyle.        Anxiety state    Stable period off medication.  Encouraged ongoing healthy stress relieving strategies.  He will let me know if symptoms becoming overwhelming/unmanageable.         Difficulty swallowing pills   Other Visit Diagnoses     Multiple nevi       Relevant Orders   Ambulatory referral to Dermatology        No orders of the defined types were placed in this encounter.  Orders Placed This Encounter  Procedures   Ambulatory referral to Dermatology    Referral Priority:   Routine    Referral Type:   Consultation    Referral Reason:   Specialty Services Required    Requested Specialty:   Dermatology    Number of Visits Requested:   1     Patient instructions: We will refer you to dermatology for mole check and discuss mole removal.  You are doing well today Continue healthy diet, incorporate regular exercise into routine.  Return  as needed or in 1 year for next physical   Follow up plan: Return in about 1 year (around 04/28/2022) for annual exam, prior fasting for blood work.  Ria Bush, MD

## 2021-04-28 NOTE — Patient Instructions (Addendum)
We will refer you to dermatology for mole check and discuss mole removal.  You are doing well today Continue healthy diet, incorporate regular exercise into routine.  Return as needed or in 1 year for next physical   Health Maintenance, Male Adopting a healthy lifestyle and getting preventive care are important in promoting health and wellness. Ask your health care provider about: The right schedule for you to have regular tests and exams. Things you can do on your own to prevent diseases and keep yourself healthy. What should I know about diet, weight, and exercise? Eat a healthy diet  Eat a diet that includes plenty of vegetables, fruits, low-fat dairy products, and lean protein. Do not eat a lot of foods that are high in solid fats, added sugars, or sodium.  Maintain a healthy weight Body mass index (BMI) is a measurement that can be used to identify possible weight problems. It estimates body fat based on height and weight. Your health care provider can help determine your BMI and help you achieve or maintain ahealthy weight. Get regular exercise Get regular exercise. This is one of the most important things you can do for your health. Most adults should: Exercise for at least 150 minutes each week. The exercise should increase your heart rate and make you sweat (moderate-intensity exercise). Do strengthening exercises at least twice a week. This is in addition to the moderate-intensity exercise. Spend less time sitting. Even light physical activity can be beneficial. Watch cholesterol and blood lipids Have your blood tested for lipids and cholesterol at 30 years of age, then havethis test every 5 years. You may need to have your cholesterol levels checked more often if: Your lipid or cholesterol levels are high. You are older than 30 years of age. You are at high risk for heart disease. What should I know about cancer screening? Many types of cancers can be detected early and may  often be prevented. Depending on your health history and family history, you may need to have cancer screening at various ages. This may include screening for: Colorectal cancer. Prostate cancer. Skin cancer. Lung cancer. What should I know about heart disease, diabetes, and high blood pressure? Blood pressure and heart disease High blood pressure causes heart disease and increases the risk of stroke. This is more likely to develop in people who have high blood pressure readings, are of African descent, or are overweight. Talk with your health care provider about your target blood pressure readings. Have your blood pressure checked: Every 3-5 years if you are 32-21 years of age. Every year if you are 56 years old or older. If you are between the ages of 64 and 55 and are a current or former smoker, ask your health care provider if you should have a one-time screening for abdominal aortic aneurysm (AAA). Diabetes Have regular diabetes screenings. This checks your fasting blood sugar level. Have the screening done: Once every three years after age 35 if you are at a normal weight and have a low risk for diabetes. More often and at a younger age if you are overweight or have a high risk for diabetes. What should I know about preventing infection? Hepatitis B If you have a higher risk for hepatitis B, you should be screened for this virus. Talk with your health care provider to find out if you are at risk forhepatitis B infection. Hepatitis C Blood testing is recommended for: Everyone born from [birth year] through 1965. Anyone with known risk factors  for hepatitis C. Sexually transmitted infections (STIs) You should be screened each year for STIs, including gonorrhea and chlamydia, if: You are sexually active and are younger than 31 years of age. You are older than 30 years of age and your health care provider tells you that you are at risk for this type of infection. Your sexual activity has  changed since you were last screened, and you are at increased risk for chlamydia or gonorrhea. Ask your health care provider if you are at risk. Ask your health care provider about whether you are at high risk for HIV. Your health care provider may recommend a prescription medicine to help prevent HIV infection. If you choose to take medicine to prevent HIV, you should first get tested for HIV. You should then be tested every 3 months for as long as you are taking the medicine. Follow these instructions at home: Lifestyle Do not use any products that contain nicotine or tobacco, such as cigarettes, e-cigarettes, and chewing tobacco. If you need help quitting, ask your health care provider. Do not use street drugs. Do not share needles. Ask your health care provider for help if you need support or information about quitting drugs. Alcohol use Do not drink alcohol if your health care provider tells you not to drink. If you drink alcohol: Limit how much you have to 0-2 drinks a day. Be aware of how much alcohol is in your drink. In the [place] one drink equals one 12 oz bottle of beer (355 mL), one 5 oz glass of wine (148 mL), or one 1 oz glass of hard liquor (44 mL). General instructions Schedule regular health, dental, and eye exams. Stay current with your vaccines. Tell your health care provider if: You often feel depressed. You have ever been abused or do not feel safe at home. Summary Adopting a healthy lifestyle and getting preventive care are important in promoting health and wellness. Follow your health care provider's instructions about healthy diet, exercising, and getting tested or screened for diseases. Follow your health care provider's instructions on monitoring your cholesterol and blood pressure. This information is not intended to replace advice given to you by your health care provider. Make sure you discuss any questions you have with your healthcare provider. Document  Revised: 10/12/2018 Document Reviewed: 10/12/2018 Elsevier Patient Education  2022 [employer].

## 2021-05-05 ENCOUNTER — Ambulatory Visit: Admit: 2021-05-05 | Discharge status: Against Medical Advice | Payer: BLUE CROSS/BLUE SHIELD

## 2021-05-06 ENCOUNTER — Other Ambulatory Visit: Payer: Self-pay

## 2021-05-06 ENCOUNTER — Telehealth: Payer: Self-pay

## 2021-05-06 ENCOUNTER — Ambulatory Visit (INDEPENDENT_AMBULATORY_CARE_PROVIDER_SITE_OTHER): Payer: BLUE CROSS/BLUE SHIELD | Admitting: Family Medicine

## 2021-05-06 ENCOUNTER — Encounter: Payer: Self-pay | Admitting: Family Medicine

## 2021-05-06 DIAGNOSIS — N50811 Right testicular pain: Secondary | ICD-10-CM

## 2021-05-06 DIAGNOSIS — F411 Generalized anxiety disorder: Secondary | ICD-10-CM | POA: Diagnosis not present

## 2021-05-06 DIAGNOSIS — N50812 Left testicular pain: Secondary | ICD-10-CM

## 2021-05-06 DIAGNOSIS — N50819 Testicular pain, unspecified: Secondary | ICD-10-CM | POA: Insufficient documentation

## 2021-05-06 LAB — POC URINALSYSI DIPSTICK (AUTOMATED)
Bilirubin, UA: NEGATIVE
Blood, UA: NEGATIVE
Glucose, UA: NEGATIVE
Ketones, UA: NEGATIVE
Leukocytes, UA: NEGATIVE
Nitrite, UA: NEGATIVE
Protein, UA: NEGATIVE
Spec Grav, UA: 1.01 (ref 1.010–1.025)
Urobilinogen, UA: 0.2 E.U./dL
pH, UA: 6 (ref 5.0–8.0)

## 2021-05-06 NOTE — Telephone Encounter (Signed)
Pt was seen in office today by Dr. Glori Bickers.

## 2021-05-06 NOTE — Assessment & Plan Note (Addendum)
Pt is admittedly anxious today - re: testicular symptoms Reviewed past notes re: this  Chronic but worse with stressors Has declined medication in the past

## 2021-05-06 NOTE — Telephone Encounter (Signed)
[facility] - Client Nonclinical Telephone Record  [facility] - Client Client Site [place] Physician Ria Bush - MD Contact Type Call Who Is Calling Patient / Member / Family / Caregiver Caller Name Adriano Bischof Caller Phone Number [phone] Patient Name Jose Estes Patient DOB 1990-11-09 Call Type Message Only Information Provided Reason for Call Request to Schedule Office Appointment Initial Comment Caller states he would like to be seen today. Sx of sensitivity and discomfort at testicles as well as lower back pain. Patient request to speak to RN No Additional Comment Office information provided; declined triage. Disp. Time Disposition Final User 05/06/2021 7:27:34 AM General Information Provided Yes Lacretia Nicks Call Closed By: Lacretia Nicks Transaction Date/Time: 05/06/2021 7:25:28 AM (ET)

## 2021-05-06 NOTE — Addendum Note (Signed)
Addended by: Ellamae Sia on: 05/06/2021 02:14 PM   Modules accepted: Orders

## 2021-05-06 NOTE — Progress Notes (Signed)
Subjective:    Patient ID: Jose Estes, male    DOB: 1991-03-11, 30 y.o.   MRN: [id number]  This visit occurred during the SARS-CoV-2 public health emergency.  Safety protocols were in place, including screening questions prior to the visit, additional usage of staff PPE, and extensive cleaning of exam room while observing appropriate contact time as indicated for disinfecting solutions.   HPI 30 yo pt of Dr Darnell Level presents with testicle discomfort /back pain   Wt Readings from Last 3 Encounters:  05/06/21 192 lb (87.1 kg)  04/28/21 194 lb (88 kg)  03/12/21 192 lb (87.1 kg)   26.78 kg/m  Last week noted discomfort in testicles  (intermittent)  Switches sides  Not pain but is uncomfortable  R testicle was sensitive on self exam Noted some R sided back pain also   One side felt more tense also   Quite anxious over it   Has used some heat and ice  Ibuprofen and tylenol   Nothing seems to flare it    No worries about STDs  Monogamous  No penile d/c No itch /burn or rash   Just a little twinge today in R back  No new exercise  Has been trying to stretch more  No heavy lifting  No h/o hernia   Has never had kidney stone No blood in urine No pain to urinate No change in frequent   Ua today: Results for orders placed or performed in visit on 05/06/21  POCT Urinalysis Dipstick (Automated)  Result Value Ref Range   Color, UA Yellow    Clarity, UA Clear    Glucose, UA Negative Negative   Bilirubin, UA Negative    Ketones, UA Negative    Spec Grav, UA 1.010 1.010 - 1.025   Blood, UA Negative    pH, UA 6.0 5.0 - 8.0   Protein, UA Negative Negative   Urobilinogen, UA 0.2 0.2 or 1.0 E.U./dL   Nitrite, UA Negative    Leukocytes, UA Negative Negative     Of note, he had L testicular soreness in 2020 (worse than this)  Scrotal US was nl at that time  Report from that reviewed:  US SCROTUM W/DOPPLER (Accession [id number]) (Order [id number]) Imaging Date:  04/18/2019 Department: [place] Released By: Janan Ridge Authorizing: Ria Bush, MD    Exam Status  Status  Final [99]   PACS Intelerad Image Link   Show images for US SCROTUM W/DOPPLER  Study Result  Narrative & Impression  CLINICAL DATA:  Left-sided testicular pain   EXAM: SCROTAL ULTRASOUND   DOPPLER ULTRASOUND OF THE TESTICLES   TECHNIQUE: Complete ultrasound examination of the testicles, epididymis, and other scrotal structures was performed. Color and spectral Doppler ultrasound were also utilized to evaluate blood flow to the testicles.   COMPARISON:  None.   FINDINGS: Right testicle   Measurements: 4.9 x 2.5 x 2.9 cm. No mass or microlithiasis visualized.   Left testicle   Measurements: 4.4 x 2.6 x 3 cm. No mass or microlithiasis visualized.   Right epididymis: There is a small 0.4 x 0.4 x 0.5 cm hypoechoic nodule in the epididymis, favored to represent a slightly complex cyst.   Left epididymis: There is a small left-sided epididymal head cyst measuring 0.4 x 0.5 x 0.3 cm.   Hydrocele:  None visualized.   Varicocele:  None visualized.   Pulsed Doppler interrogation of both testes demonstrates normal low resistance arterial and venous waveforms bilaterally.  IMPRESSION: No acute sonographic abnormality. No evidence for testicular torsion. No findings to explain the patient's left-sided pain.      UA today:     Review of Systems  Constitutional:  Negative for activity change, appetite change, fatigue, fever and unexpected weight change.  HENT:  Negative for congestion, rhinorrhea, sore throat and trouble swallowing.   Eyes:  Negative for pain, redness, itching and visual disturbance.  Respiratory:  Negative for cough, chest tightness, shortness of breath and wheezing.   Cardiovascular:  Negative for chest pain and palpitations.  Gastrointestinal:  Negative for abdominal pain,  blood in stool, constipation, diarrhea and nausea.  Endocrine: Negative for cold intolerance, heat intolerance, polydipsia and polyuria.  Genitourinary:  Positive for testicular pain. Negative for decreased urine volume, difficulty urinating, dysuria, flank pain, frequency, genital sores, hematuria, penile discharge, penile swelling and urgency.  Musculoskeletal:  Negative for arthralgias, joint swelling and myalgias.  Skin:  Negative for pallor and rash.  Neurological:  Negative for dizziness, tremors, weakness, numbness and headaches.  Hematological:  Negative for adenopathy. Does not bruise/bleed easily.  Psychiatric/Behavioral:  Negative for decreased concentration and dysphoric mood. The patient is nervous/anxious.       Objective:   Physical Exam Exam conducted with a chaperone present.  Constitutional:      General: He is not in acute distress.    Appearance: Normal appearance. He is normal weight. He is not ill-appearing.  Eyes:     General:        Right eye: No discharge.        Left eye: No discharge.     Conjunctiva/sclera: Conjunctivae normal.     Pupils: Pupils are equal, round, and reactive to light.  Cardiovascular:     Rate and Rhythm: Normal rate and regular rhythm.     Heart sounds: Normal heart sounds.  Abdominal:     General: Abdomen is flat. Bowel sounds are normal. There is no distension.     Palpations: Abdomen is soft. There is no mass.     Tenderness: There is no abdominal tenderness. There is no guarding or rebound.     Hernia: No hernia is present. There is no hernia in the left inguinal area or right inguinal area.  Genitourinary:    Pubic Area: No rash.      Penis: Normal and circumcised. No tenderness, discharge, swelling or lesions.      Testes:        Right: Tenderness present. Mass, swelling, testicular hydrocele or varicocele not present. Right testis is descended. Cremasteric reflex is present.         Left: Tenderness present. Mass, swelling,  testicular hydrocele or varicocele not present. Left testis is descended. Cremasteric reflex is present.      Epididymis:     Right: Not inflamed or enlarged. Tenderness present. No mass.     Left: Not inflamed or enlarged. Tenderness present. No mass.     Comments: Mild tenderness of both testicles on exam  Musculoskeletal:     Cervical back: Normal range of motion.  Lymphadenopathy:     Lower Body: No right inguinal adenopathy. No left inguinal adenopathy.  Skin:    Coloration: Skin is not pale.     Findings: No erythema or rash.  Neurological:     Mental Status: He is alert.     Sensory: No sensory deficit.  Psychiatric:        Mood and Affect: Mood is anxious.     Comments: Mildly  anxious  Pt discusses this candidly          Assessment & Plan:   Problem List Items Addressed This Visit       Other   Anxiety state    Pt is admittedly anxious today - re: testicular symptoms Reviewed past notes re: this  Chronic but worse with stressors Has declined medication in the past       Testicle pain    Bilateral Describes as uncomfortable and bothersome without swelling  Exam is normal but pt has bilateral tenderness (mild)  Disc poss of epididymitis  Will screen for gc/chlam Also urinalysis  Rev last US scrotum for similar symptoms and it was nl  Plan to follow results       Relevant Orders   POCT Urinalysis Dipstick (Automated)

## 2021-05-06 NOTE — Patient Instructions (Signed)
Let's check urine for infection and also do a gonorrhea and chlamydia screen   We will update with results Make sure your clothing is not too tight or loose  Supportive but not tight

## 2021-05-06 NOTE — Assessment & Plan Note (Signed)
Bilateral Describes as uncomfortable and bothersome without swelling  Exam is normal but pt has bilateral tenderness (mild)  Disc poss of epididymitis  Will screen for gc/chlam Also urinalysis  Rev last US scrotum for similar symptoms and it was nl  Plan to follow results

## 2021-05-07 LAB — C. TRACHOMATIS/N. GONORRHOEAE RNA
C. trachomatis RNA, TMA: NOT DETECTED
N. gonorrhoeae RNA, TMA: NOT DETECTED

## 2021-05-08 ENCOUNTER — Telehealth: Payer: Self-pay | Admitting: *Deleted

## 2021-05-08 ENCOUNTER — Telehealth: Payer: Self-pay | Admitting: Family Medicine

## 2021-05-08 DIAGNOSIS — N50811 Right testicular pain: Secondary | ICD-10-CM

## 2021-05-08 DIAGNOSIS — N50812 Left testicular pain: Secondary | ICD-10-CM

## 2021-05-08 NOTE — Telephone Encounter (Signed)
-----   Message from Tammi Sou, [place] sent at 05/08/2021  4:50 PM EDT ----- Pt notified of lab results and Dr. Marliss Coots comments. Pt agrees with urologist referral, no preference in which city he said 1st available appt in either city is fine

## 2021-05-08 NOTE — Telephone Encounter (Signed)
Referral done Cc to pcp

## 2021-05-08 NOTE — Telephone Encounter (Signed)
Left VM requesting pt to call the office back 

## 2021-05-08 NOTE — Telephone Encounter (Signed)
-----   Message from Abner Greenspan, MD sent at 05/07/2021  9:03 PM EDT ----- STD screen is negative as expected  Since symptoms are so significant, I recommend a referral to urology for further eval and to discuss tx  Does he prefer [place] or [place]?

## 2021-05-08 NOTE — Telephone Encounter (Signed)
Noted. Thank you for seeing him.  

## 2021-05-08 NOTE — Telephone Encounter (Signed)
Addressed through result notes  

## 2021-05-14 ENCOUNTER — Ambulatory Visit (INDEPENDENT_AMBULATORY_CARE_PROVIDER_SITE_OTHER): Payer: BLUE CROSS/BLUE SHIELD | Admitting: Urology

## 2021-05-14 ENCOUNTER — Other Ambulatory Visit: Payer: Self-pay

## 2021-05-14 ENCOUNTER — Telehealth: Payer: Self-pay | Admitting: *Deleted

## 2021-05-14 ENCOUNTER — Encounter: Payer: Self-pay | Admitting: Urology

## 2021-05-14 VITALS — BP 130/79 | HR 80 | Ht 71.0 in | Wt 190.0 lb

## 2021-05-14 DIAGNOSIS — R1032 Left lower quadrant pain: Secondary | ICD-10-CM | POA: Diagnosis not present

## 2021-05-14 DIAGNOSIS — M545 Low back pain, unspecified: Secondary | ICD-10-CM

## 2021-05-14 DIAGNOSIS — R1031 Right lower quadrant pain: Secondary | ICD-10-CM

## 2021-05-14 LAB — MICROSCOPIC EXAMINATION
Bacteria, UA: NONE SEEN
RBC, Urine: NONE SEEN /hpf (ref 0–2)
WBC, UA: NONE SEEN /hpf (ref 0–5)

## 2021-05-14 LAB — URINALYSIS, COMPLETE
Bilirubin, UA: NEGATIVE
Glucose, UA: NEGATIVE
Ketones, UA: NEGATIVE
Leukocytes,UA: NEGATIVE
Nitrite, UA: NEGATIVE
Protein,UA: NEGATIVE
RBC, UA: NEGATIVE
Specific Gravity, UA: 1.015 (ref 1.005–1.030)
Urobilinogen, Ur: 0.2 mg/dL (ref 0.2–1.0)
pH, UA: 7.5 (ref 5.0–7.5)

## 2021-05-14 MED ORDER — CELECOXIB 200 MG PO CAPS: 200.0000 mg | ORAL_CAPSULE | Freq: Two times a day (BID) | ORAL | 0 refills | Status: DC | PRN

## 2021-05-14 NOTE — Telephone Encounter (Signed)
Patient states he was able to get in touch with Neurologist-Dr Melrose Nakayama and they can see him tomorrow for this issue. Nothing further is needed.

## 2021-05-14 NOTE — Telephone Encounter (Signed)
I have a pt Friday afternoon (2:30pm) that may be taken off my schedule.  If taken off, I can see him at that time.  Otherwise I can see him Monday at 12:30pm

## 2021-05-14 NOTE — Progress Notes (Signed)
   05/14/2021 9:32 AM   Henreitta Leber 1991/08/19 [id number]  Referring provider: Abner Greenspan, MD [address] [place],  [place] [address]  Chief Complaint  Patient presents with   Testicle Pain    HPI: Jose Estes is a 30 y.o. male referred for bilateral scrotal pain.  Seen by PCP 05/06/2021 complaining of intermittent scrotal pain Today he states his pain is more located in the lower back region Pain is intermittent and varies from right to left Described as a dull ache Some radiation into the groin region but denies scrotal pain No bothersome LUTS Evaluated for scrotal pain in 2020 and a scrotal ultrasound showed no abnormalities; there was a small right epididymal cyst Urinalysis was unremarkable   PMH: Past Medical History:  Diagnosis Date   Depression 2012   Paronychia of left thumb 2015    Surgical History: Past Surgical History:  Procedure Laterality Date   TONSILLECTOMY  2011    Home Medications:  Allergies as of 05/14/2021   No Known Allergies      Medication List        Accurate as of May 14, 2021  9:32 AM. If you have any questions, ask your nurse or doctor.          celecoxib 200 MG capsule Commonly known as: CeleBREX Take 1 capsule (200 mg total) by mouth 2 (two) times daily as needed. Started by: Abbie Sons, MD        Allergies: No Known Allergies  Family History: Family History  Problem Relation Age of Onset   Anxiety disorder Mother    Depression Mother    Hypertension Father    Anxiety disorder Maternal Grandmother    Arthritis Maternal Grandmother    CAD Neg Hx    Stroke Neg Hx    Diabetes Neg Hx    Cancer Neg Hx     Social History:  reports that he quit smoking about 5 years ago. His smoking use included e-cigarettes. He has never used smokeless tobacco. He reports current alcohol use. He reports current drug use. Drug: Marijuana.   Physical Exam: BP 130/79   Pulse 80   Ht 5\' 11"  (1.803 m)   Wt  190 lb (86.2 kg)   BMI 26.50 kg/m   Constitutional:  Alert and oriented, No acute distress. HEENT: [place] AT, moist mucus membranes.  Trachea midline, no masses. Cardiovascular: No clubbing, cyanosis, or edema. Respiratory: Normal respiratory effort, no increased work of breathing. GI: Abdomen is soft, nontender, nondistended, no abdominal masses GU: Phallus without lesions, testes descended bilateral without masses or tenderness.  Spermatic cord/epididymis palpably normal bilaterally Skin: No rashes, bruises or suspicious lesions. Neurologic: Grossly intact, no focal deficits, moving all 4 extremities. Psychiatric: Normal mood and affect.  Laboratory Data:  Urinalysis Dipstick/microscopy negative  Pertinent Imaging: Scrotal ultrasound 04/2019 images were personally reviewed and interpreted   Assessment & Plan:   Bilateral low back pain with some radiation into the groin region Most likely musculoskeletal in etiology with neuropathic radiation to groin Trial Celebrex 200 mg twice daily as needed-Rx sent PCP follow-up for further evaluation/management   Abbie Sons, MD  [facility] [address] [place], [place] [address] [phone]

## 2021-05-14 NOTE — Telephone Encounter (Signed)
Pt called in trying to get an appt with PCP to discuss some ongoing issues. PCP's 1st available is a week away 05/23/21. Pt wanted me to send a message to PCP to see if he can be worked in before that or if he needs to see another provider. Pt would like to see PCP if possible.   Pt said he has seen Dr. Glori Bickers and a urologist for some testicle pain. Pt said that is better but he is now having lower back pain and also there is a lump in his leg he wants PCP to check out. Please advise if PCP can work pt in before next Friday or if I need to schedule an appt with another provider some time later this week.

## 2021-05-21 ENCOUNTER — Encounter: Payer: Self-pay | Admitting: Family Medicine

## 2021-05-21 DIAGNOSIS — F411 Generalized anxiety disorder: Secondary | ICD-10-CM

## 2021-05-23 NOTE — Addendum Note (Signed)
Addended by: Ria Bush on: 05/23/2021 04:13 PM   Modules accepted: Orders

## 2021-05-29 ENCOUNTER — Encounter: Payer: Self-pay | Admitting: Family Medicine

## 2021-06-04 DIAGNOSIS — Z7689 Persons encountering health services in other specified circumstances: Secondary | ICD-10-CM | POA: Diagnosis not present

## 2021-06-04 DIAGNOSIS — S39012A Strain of muscle, fascia and tendon of lower back, initial encounter: Secondary | ICD-10-CM | POA: Diagnosis not present

## 2021-06-05 DIAGNOSIS — S39012A Strain of muscle, fascia and tendon of lower back, initial encounter: Secondary | ICD-10-CM | POA: Diagnosis not present

## 2021-06-11 ENCOUNTER — Other Ambulatory Visit: Payer: Self-pay | Admitting: Family Medicine

## 2021-06-11 DIAGNOSIS — M5442 Lumbago with sciatica, left side: Secondary | ICD-10-CM

## 2021-06-18 ENCOUNTER — Ambulatory Visit
Admission: RE | Admit: 2021-06-18 | Discharge: 2021-06-18 | Disposition: A | Discharge status: Home / Self Care | Payer: No Typology Code available for payment source | Source: Ambulatory Visit | Attending: Family Medicine | Admitting: Family Medicine

## 2021-06-18 DIAGNOSIS — M5442 Lumbago with sciatica, left side: Secondary | ICD-10-CM

## 2021-06-18 IMAGING — MR MR LUMBAR SPINE W/O CM
4 of 5 series · 28 of 48 positions shown · non-contrast
Comparison: None.

CLINICAL DATA: Low back

EXAM:
MRI LUMBAR SPINE WITHOUT CONTRAST
TECHNIQUE: Multiplanar, multisequence MR imaging of the lumbar spine was
performed. No intravenous contrast was administered.

[Series 2: T2 · sagittal · 4.0mm · 0.59mm/px · 4 of 13 slices shown (1 of 2)]
[im 1/13]
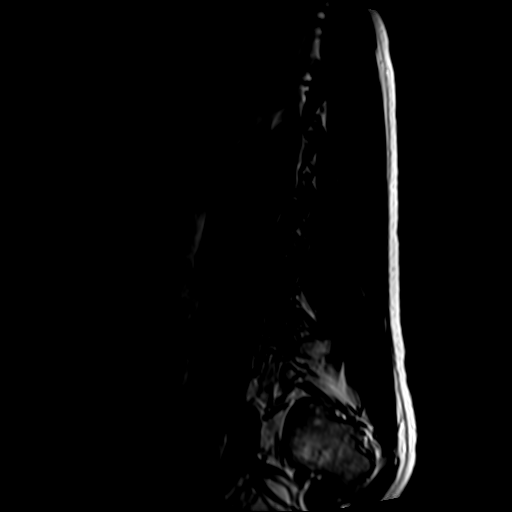
[im 5/13]
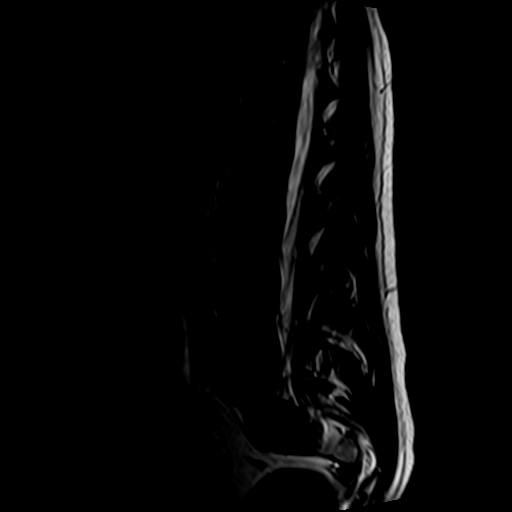
[im 9/13]
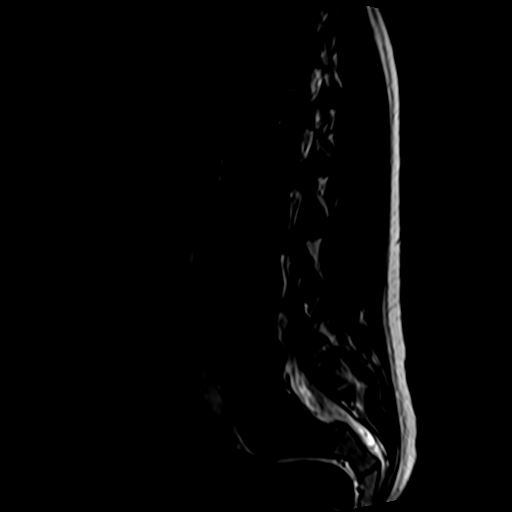
[im 13/13]
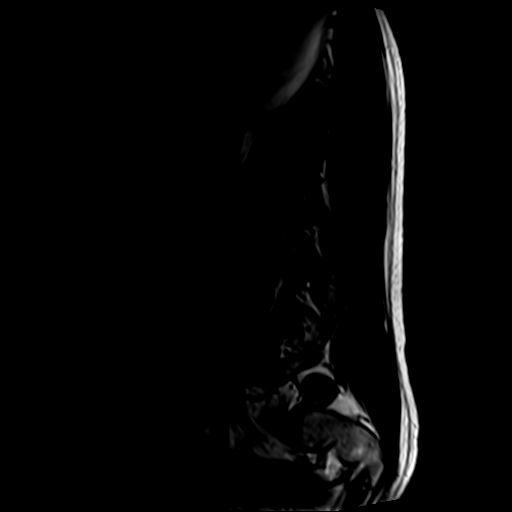

[Series 4: T1 · sagittal · 4.0mm · 0.59mm/px · 5 of 13 slices shown (1 of 2)]
[im 1/13]
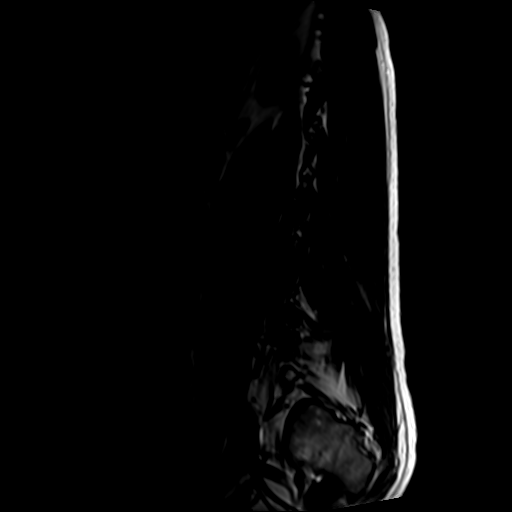
[im 4/13]
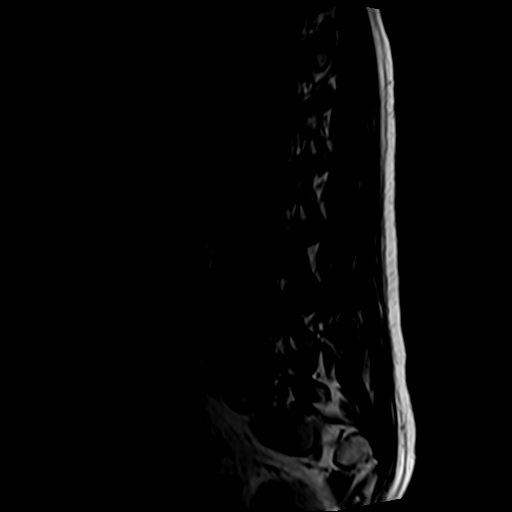
[im 7/13]
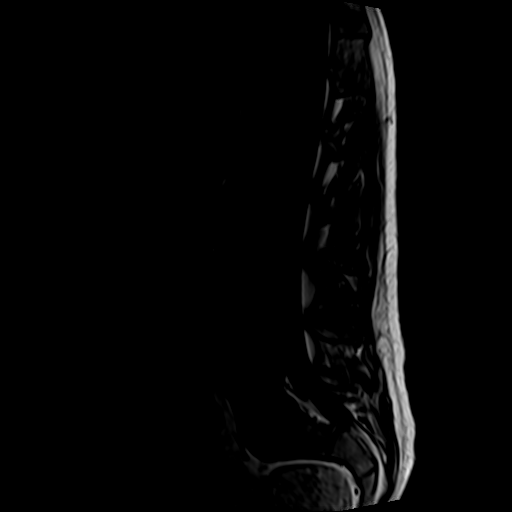
[im 10/13]
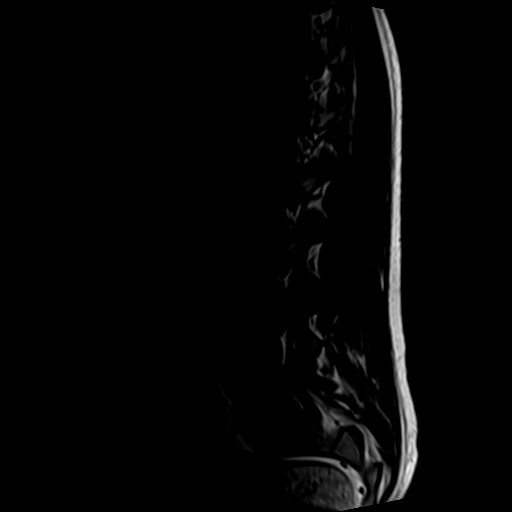
[im 13/13]
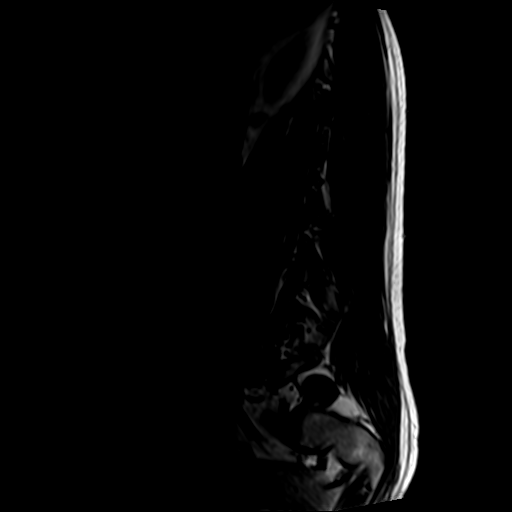

[Series 5: T2 · axial · 4.0mm · 0.70mm/px · z∈[-95,+116]mm · 11 of 43 slices shown (2 of 2)]
[im 3/43]
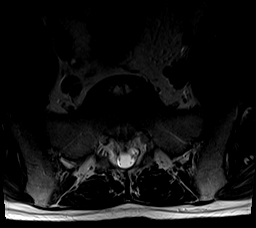
[im 6/43]
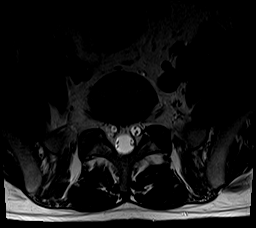
[im 8/43]
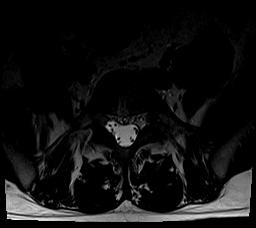
[im 14/43]
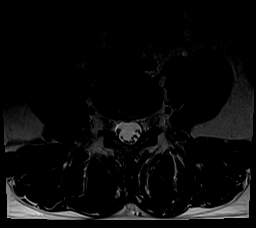
[im 19/43]
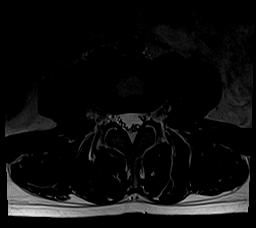
[im 22/43]
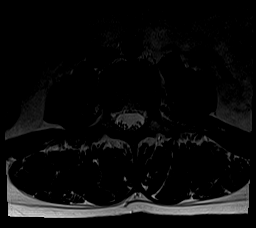
[im 24/43]
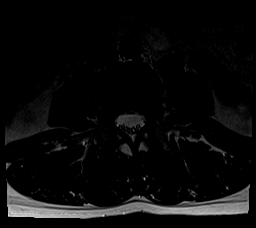
[im 29/43]
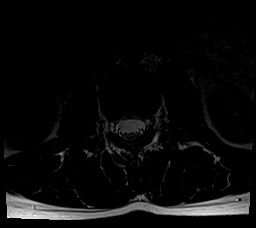
[im 35/43]
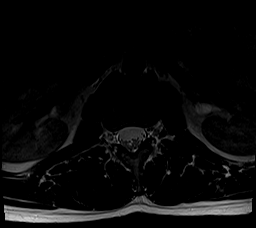
[im 37/43]
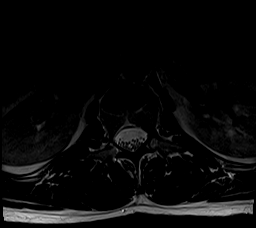
[im 40/43]
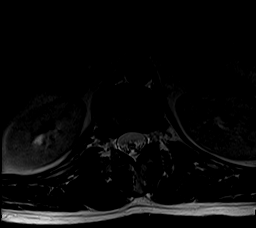

[Series 6: T1 · axial · 4.0mm · 0.35mm/px · z∈[-95,+100]mm · 8 of 43 slices shown (2 of 2)]
[im 3/43]
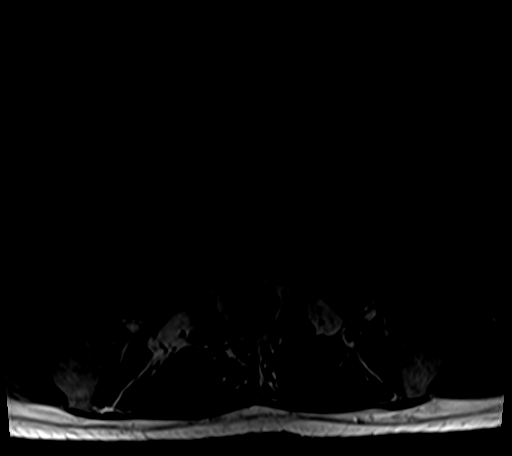
[im 6/43]
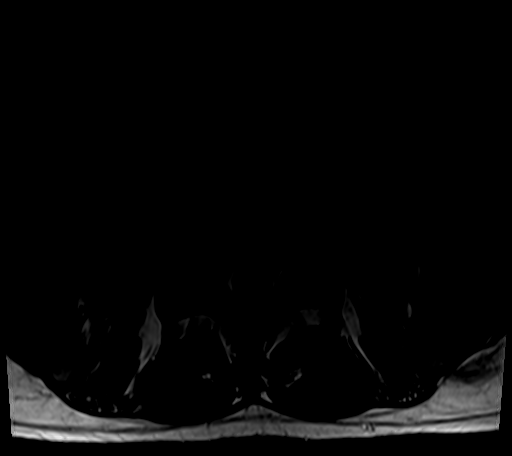
[im 8/43]
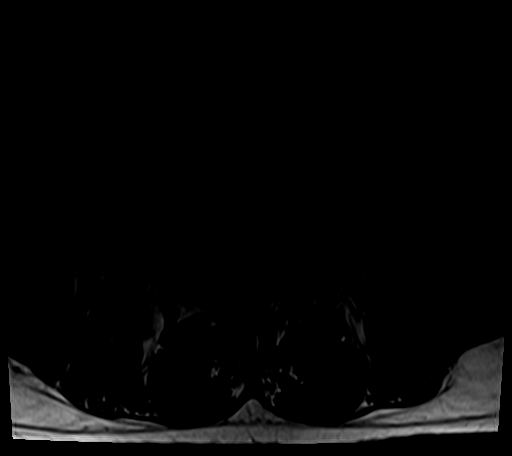
[im 14/43]
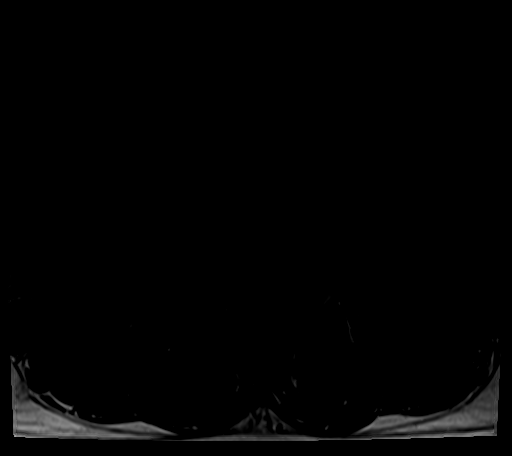
[im 19/43]
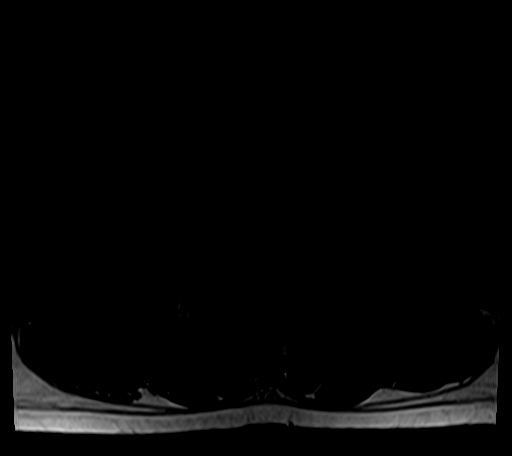
[im 22/43]
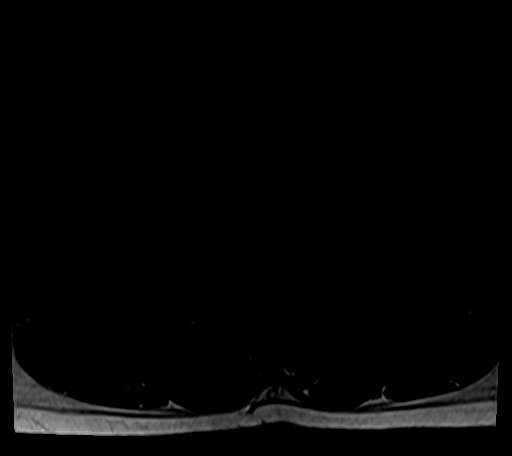
[im 24/43]
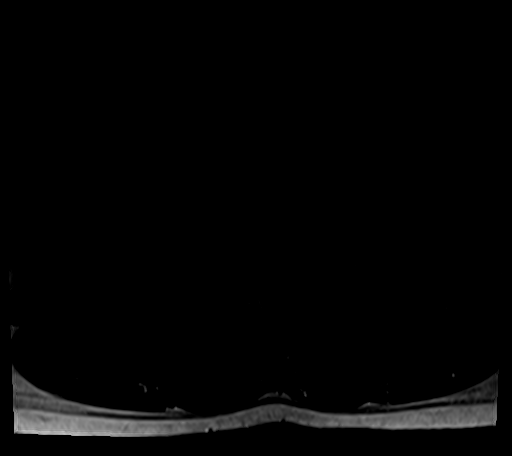
[im 37/43]
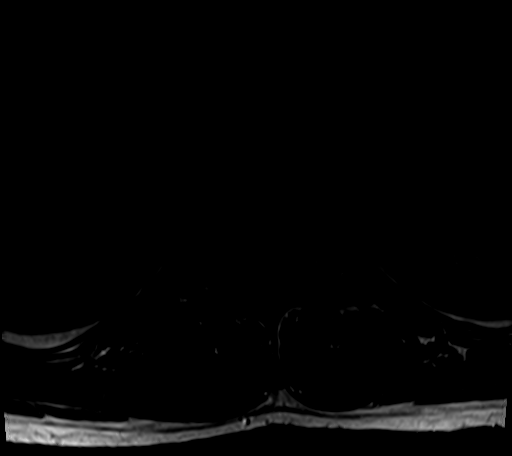

[28 of 48 positions shown; findings below may reference images not displayed]

FINDINGS: Segmentation:  Standard.

Alignment:  Physiologic.

Vertebrae:  No fracture, evidence of discitis, or bone lesion.

Conus medullaris and cauda equina: Conus extends to the T12-L1
level. Conus and cauda equina appear normal.

Paraspinal and other soft tissues: Negative.

Disc levels:

T12-L1: No significant disc bulge. No spinal canal stenosis or
neural foraminal narrowing.

L1-L2: No significant disc bulge. No spinal canal stenosis or neural
foraminal narrowing.

L2-L3: No significant disc bulge. No spinal canal stenosis or neural
foraminal narrowing.

L3-L4: No significant disc bulge. Mild facet arthropathy. No spinal
canal stenosis or neural foraminal narrowing.

L4-L5: No significant disc bulge. No spinal canal stenosis or neural
foraminal narrowing.

L5-S1: No significant disc bulge. No spinal canal stenosis or neural
foraminal narrowing.
IMPRESSION: No significant degenerative changes in the lumbar spine.

## 2021-06-21 ENCOUNTER — Other Ambulatory Visit: Payer: BLUE CROSS/BLUE SHIELD

## 2021-06-23 ENCOUNTER — Telehealth: Payer: Self-pay | Admitting: Family Medicine

## 2021-06-23 NOTE — Telephone Encounter (Signed)
Pt wanted to know if he was due for labs and he wanted to see if he had any  vitamin deficiency

## 2021-06-23 NOTE — Telephone Encounter (Signed)
Not due for labs - last done 01/2021.  What symptoms is he having to check vitamins? This may not be covered by insurance.

## 2021-06-24 NOTE — Telephone Encounter (Signed)
Tried calling the patient to ask more questions for further evaluation, the patient did not answer and I LVM for him to call back.

## 2021-06-24 NOTE — Telephone Encounter (Signed)
Patient was called and he states that he has been having muscle cramps and its not specific to one area. He also states that he is having some fatigue and wants to rule out any type of deficiency for his piece of mind. Patient also will have no insurance until November according to him. Please advise what next steps need to be.

## 2021-06-24 NOTE — Telephone Encounter (Signed)
How long has fatigue been going on? Would offer labs (vit b12, d, thyroid blood counts) if willing to pay out of pocket for labs or alternatively schedule OV in November to review.

## 2021-06-24 NOTE — Telephone Encounter (Signed)
Patient states about a month and a half. He has stated that he would like to wait till November because he wants have his insurance lined up for the labs and then go from there.

## 2021-06-25 ENCOUNTER — Ambulatory Visit (INDEPENDENT_AMBULATORY_CARE_PROVIDER_SITE_OTHER): Payer: Self-pay | Admitting: Psychologist

## 2021-06-25 DIAGNOSIS — F4322 Adjustment disorder with anxiety: Secondary | ICD-10-CM

## 2021-07-03 ENCOUNTER — Ambulatory Visit: Payer: Self-pay | Admitting: Psychologist

## 2021-07-04 ENCOUNTER — Ambulatory Visit (INDEPENDENT_AMBULATORY_CARE_PROVIDER_SITE_OTHER): Payer: Self-pay | Admitting: Psychologist

## 2021-07-04 DIAGNOSIS — F4322 Adjustment disorder with anxiety: Secondary | ICD-10-CM

## 2021-07-11 ENCOUNTER — Ambulatory Visit: Payer: Self-pay | Admitting: Psychologist

## 2021-07-25 ENCOUNTER — Telehealth: Payer: Self-pay | Admitting: Family Medicine

## 2021-07-25 NOTE — Telephone Encounter (Signed)
I spoke with pt; pt said he has shaky hands for 1 - 2 wks, anxiety more so recently but having anxiety on and off for 3 months but worsened recently, when puts feet down in mornings has pins and needles in feet. For 3 mths has lower back pain and has seen more than one doctor and pt is presently in physical therapy. Pt wonders if could be vitamin deficiency. Pt said no SI/HI.pt is taking magnesium and fish oil for anxiety.  Pt is going to go to [place] now since it is Fri at 5pm to be eval and then will cb next wk with update and schedule FU appt. Sending note to Dr Darnell Level and Lattie Haw CMA. Also sending teams to [place].

## 2021-07-25 NOTE — Telephone Encounter (Signed)
[facility] - Client TELEPHONE ADVICE RECORD [facility] Patient Name: Norm Hezzie Bump Gender: Male DOB: [birth year] Age: 30 Y 71 M 12 D Return Phone Number: [phone] (Primary) Address: City/ State/ Zip: [place] [address] Client [facility] - Client Client Site [facility] - Day Physician Ria Bush - MD Contact Type Call Who Is Calling Patient / Member / Family / Caregiver Call Type Triage / Clinical Relationship To Patient Self Return Phone Number [phone] (Primary) Chief Complaint Anxiety and Panic Attack Reason for Call Symptomatic / Request for [place] states he has been hand tremors for about a month and anxiety and low back pain. Translation No Disp. Time Eilene Ghazi Time) Disposition Final User 07/25/2021 3:17:03 PM Attempt made - message left Altamease Oiler RN, [place] 07/25/2021 3:39:00 PM FINAL ATTEMPT MADE - message left Yes Altamease Oiler, RN, Fabio Bering

## 2021-07-25 NOTE — Telephone Encounter (Signed)
Noted. Will await call next week.

## 2021-08-08 ENCOUNTER — Encounter: Payer: Self-pay | Admitting: Family Medicine

## 2021-08-08 ENCOUNTER — Ambulatory Visit (INDEPENDENT_AMBULATORY_CARE_PROVIDER_SITE_OTHER): Payer: Self-pay | Admitting: Family Medicine

## 2021-08-08 ENCOUNTER — Other Ambulatory Visit: Payer: Self-pay

## 2021-08-08 VITALS — BP 116/68 | HR 83 | Temp 97.9°F | Ht 71.0 in | Wt 176.0 lb

## 2021-08-08 DIAGNOSIS — R634 Abnormal weight loss: Secondary | ICD-10-CM

## 2021-08-08 DIAGNOSIS — F411 Generalized anxiety disorder: Secondary | ICD-10-CM

## 2021-08-08 LAB — CBC WITH DIFFERENTIAL/PLATELET
Basophils Absolute: 0 10*3/uL (ref 0.0–0.1)
Basophils Relative: 0.7 % (ref 0.0–3.0)
Eosinophils Absolute: 0.1 10*3/uL (ref 0.0–0.7)
Eosinophils Relative: 1 % (ref 0.0–5.0)
HCT: 44.7 % (ref 39.0–52.0)
Hemoglobin: 15.1 g/dL (ref 13.0–17.0)
Lymphocytes Relative: 29.4 % (ref 12.0–46.0)
Lymphs Abs: 1.8 10*3/uL (ref 0.7–4.0)
MCHC: 33.8 g/dL (ref 30.0–36.0)
MCV: 86.3 fl (ref 78.0–100.0)
Monocytes Absolute: 0.4 10*3/uL (ref 0.1–1.0)
Monocytes Relative: 6.7 % (ref 3.0–12.0)
Neutro Abs: 3.8 10*3/uL (ref 1.4–7.7)
Neutrophils Relative %: 62.2 % (ref 43.0–77.0)
Platelets: 252 10*3/uL (ref 150.0–400.0)
RBC: 5.18 Mil/uL (ref 4.22–5.81)
RDW: 13.3 % (ref 11.5–15.5)
WBC: 6.1 10*3/uL (ref 4.0–10.5)

## 2021-08-08 LAB — TSH: TSH: 1.82 u[IU]/mL (ref 0.35–5.50)

## 2021-08-08 MED ORDER — ESCITALOPRAM OXALATE 10 MG PO TABS: 10.0000 mg | ORAL_TABLET | Freq: Every day | ORAL | 6 refills | Status: DC

## 2021-08-08 MED ORDER — ALPRAZOLAM 0.25 MG PO TABS: 0.2500 mg | ORAL_TABLET | Freq: Two times a day (BID) | ORAL | 0 refills | Status: DC | PRN

## 2021-08-08 NOTE — Progress Notes (Signed)
Patient ID: Jose Estes, male    DOB: 03-Aug-1991, 30 y.o.   MRN: [id number]  This visit was conducted in person.  BP 116/68   Pulse 83   Temp 97.9 F (36.6 C) (Temporal)   Ht 5\' 11"  (1.803 m)   Wt 176 lb (79.8 kg)   SpO2 97%   BMI 24.55 kg/m    CC: anxiety Subjective:   HPI: Jose Estes is a 30 y.o. male presenting on 08/08/2021 for Anxiety (C/o continuous anxiety, shakiness and insomnia.  )   Longstanding history of anxiety with previous anxiety attacks. Previous meds tried include hydroxyzine, OTC tranquilene, and fish oil supplement for inattention. Has previously declined counseling, has been hesitant to take medications. Has difficulty swallowing tablets.   3 mo h/o progressively worsening anxiety that started around time he started having the groin/testicular and lower back pain. Notes increased shakiness over the past month as well as sleep maintenance insomnia.   He has previously tolerated lorazepam well PRN for travel.  Had some counseling sessions late August - didn't feel virtual visits were helpful. Interested in restarting this.   Weight loss noted - endorses eating healthier, also stress related.  No heat/cold intolerance, no diarrhea/constipation, fatigue.  No palpitations or blood pressure spikes.   Since last seen here, has seen urology for testicular pain and ortho and PM&R for back pain. He had reassuring lumbar MRI. Latest visit had significant improvement, plan was PT course.   Notes ongoing discomfort to lower back pain associated with uncomfortable tingling to bilateral posterior thighs. Actually back pain may be improved since he backed off exercising/running - may have overdone it.      Relevant past medical, surgical, family and social history reviewed and updated as indicated. Interim medical history since our last visit reviewed. Allergies and medications reviewed and updated. Outpatient Medications Prior to Visit  Medication Sig Dispense  Refill   MAGNESIUM PO Take 400 mg by mouth at bedtime. Powder     Omega-3 Fatty Acids (FISH OIL PO) Take by mouth at bedtime.     celecoxib (CELEBREX) 200 MG capsule Take 1 capsule (200 mg total) by mouth 2 (two) times daily as needed. 30 capsule 0   No facility-administered medications prior to visit.     Per HPI unless specifically indicated in ROS section below Review of Systems  Objective:  BP 116/68   Pulse 83   Temp 97.9 F (36.6 C) (Temporal)   Ht 5\' 11"  (1.803 m)   Wt 176 lb (79.8 kg)   SpO2 97%   BMI 24.55 kg/m   Wt Readings from Last 3 Encounters:  08/08/21 176 lb (79.8 kg)  05/14/21 190 lb (86.2 kg)  05/06/21 192 lb (87.1 kg)      Physical Exam Vitals and nursing note reviewed.  Constitutional:      Appearance: Normal appearance. He is not ill-appearing.  Neck:     Thyroid: No thyroid mass or thyromegaly.  Cardiovascular:     Rate and Rhythm: Normal rate and regular rhythm.     Pulses: Normal pulses.     Heart sounds: Normal heart sounds. No murmur heard. Pulmonary:     Effort: Pulmonary effort is normal. No respiratory distress.     Breath sounds: Normal breath sounds. No wheezing, rhonchi or rales.  Musculoskeletal:     Right lower leg: No edema.     Left lower leg: No edema.  Skin:    General: Skin is warm and dry.  Findings: No rash.  Neurological:     Mental Status: He is alert.  Psychiatric:        Mood and Affect: Mood is anxious.        Behavior: Behavior normal.        Cognition and Memory: Cognition normal.        Judgment: Judgment normal.     Comments: Tearful with discussion of stress       Depression screen [facility] 2/9 04/28/2021 02/18/2021 04/24/2020 04/18/2019 12/09/2017  Decreased Interest 1 0 0 1 2  Down, Depressed, Hopeless 1 0 0 1 1  PHQ - 2 Score 2 0 0 2 3  Altered sleeping 0 0 0 0 2  Tired, decreased energy 2 1 1 2 1   Change in appetite 2 0 0 1 2  Feeling bad or failure about yourself  1 0 0 1 1  Trouble concentrating 3 3 1 1  1   Moving slowly or fidgety/restless 2 0 2 2 1   Suicidal thoughts 0 0 0 0 0  PHQ-9 Score 12 4 4 9 11    GAD 7 : Generalized Anxiety Score 08/08/2021 04/28/2021 02/18/2021 04/24/2020  Nervous, Anxious, on Edge 3 3 1 2   Control/stop worrying 3 2 1 2   Worry too much - different things 3 2 0 2  Trouble relaxing 3 2 0 1  Restless 3 3 2 2   Easily annoyed or irritable 2 2 1 2   Afraid - awful might happen 3 1 1 1   Total GAD 7 Score 20 15 6 12   Anxiety Difficulty Very difficult - - -   Assessment & Plan:  This visit occurred during the SARS-CoV-2 public health emergency.  Safety protocols were in place, including screening questions prior to the visit, additional usage of staff PPE, and extensive cleaning of exam room while observing appropriate contact time as indicated for disinfecting solutions.   Problem List Items Addressed This Visit     GAD (generalized anxiety disorder) - Primary    Marked deterioration over the past 3 months associated with tremors, weight loss, insomnia.  Anticipate anxiety related symptoms. Check TSH, CBC. Start lexapro 10mg  daily, start xanax PRN - discussed habit forming nature of medication so would ideally only want to use temporarily. Reviewed possible side effects of lexapro.  Will place psychology referral - patient prefers in-person counseling ideally near [place].  Encouraged ongoing healthy stress relieving strategies.  Reassess when he returns for CPE in 4-6 wks. Update via MyChart in interim.       Relevant Medications   escitalopram (LEXAPRO) 10 MG tablet   ALPRAZolam (XANAX) 0.25 MG tablet   Other Relevant Orders   Ambulatory referral to Psychology   Other Visit Diagnoses     Weight loss            Meds ordered this encounter  Medications   escitalopram (LEXAPRO) 10 MG tablet    Sig: Take 1 tablet (10 mg total) by mouth daily.    Dispense:  30 tablet    Refill:  6   ALPRAZolam (XANAX) 0.25 MG tablet    Sig: Take 1 tablet (0.25  mg total) by mouth 2 (two) times daily as needed for anxiety.    Dispense:  20 tablet    Refill:  0    Orders Placed This Encounter  Procedures   TSH   CBC with Differential/Platelet   Ambulatory referral to Psychology    Referral Priority:   Routine    Referral Type:  Psychiatric    Referral Reason:   Specialty Services Required    Requested Specialty:   Psychology    Number of Visits Requested:   1     Patient Instructions  Labs today Start lexapro 10mg  daily - let me know if any trouble with medication. While lexapro takes effect (3-4 weeks), start xanax as needed for anxiety/overwhelming feeling.  Schedule physical in ~6 weeks.  Good to see you today   Follow up plan: Return in about 6 weeks (around 09/19/2021) for annual exam, prior fasting for blood work.  Ria Bush, MD

## 2021-08-08 NOTE — Assessment & Plan Note (Addendum)
Marked deterioration over the past 3 months associated with tremors, weight loss, insomnia.  Anticipate anxiety related symptoms. Check TSH, CBC. Start lexapro 10mg  daily, start xanax PRN - discussed habit forming nature of medication so would ideally only want to use temporarily. Reviewed possible side effects of lexapro.  Will place psychology referral - patient prefers in-person counseling ideally near [place].  Encouraged ongoing healthy stress relieving strategies.  Reassess when he returns for CPE in 4-6 wks. Update via MyChart in interim.

## 2021-08-08 NOTE — Patient Instructions (Signed)
Labs today Start lexapro 10mg  daily - let me know if any trouble with medication. While lexapro takes effect (3-4 weeks), start xanax as needed for anxiety/overwhelming feeling.  Schedule physical in ~6 weeks.  Good to see you today

## 2021-08-30 ENCOUNTER — Other Ambulatory Visit: Payer: Self-pay | Admitting: Family Medicine

## 2021-09-03 ENCOUNTER — Other Ambulatory Visit: Payer: Self-pay | Admitting: Neurology

## 2021-09-03 DIAGNOSIS — G379 Demyelinating disease of central nervous system, unspecified: Secondary | ICD-10-CM

## 2021-09-12 ENCOUNTER — Ambulatory Visit
Admission: RE | Admit: 2021-09-12 | Discharge: 2021-09-12 | Disposition: A | Discharge status: Home / Self Care | Payer: No Typology Code available for payment source | Source: Ambulatory Visit | Attending: Neurology | Admitting: Neurology

## 2021-09-12 ENCOUNTER — Other Ambulatory Visit: Payer: Self-pay

## 2021-09-12 DIAGNOSIS — G379 Demyelinating disease of central nervous system, unspecified: Secondary | ICD-10-CM

## 2021-09-12 IMAGING — MR MR HEAD W/O CM
10 series · 48 of 48 positions shown · non-contrast
Comparison: None.

CLINICAL DATA: Demyelinating disease. Additional history provided
by scanning technologist: Patient reports extremity weakness,
"Floaters" and double vision, symptoms for several months.

EXAM:
MRI HEAD WITHOUT CONTRAST
TECHNIQUE: Multiplanar, multiecho pulse sequences of the brain and surrounding
structures were obtained without intravenous contrast.

[Series 2: T1 · sagittal · 5.0mm · 0.45mm/px · 3 of 21 slices shown]
[im 1/21]
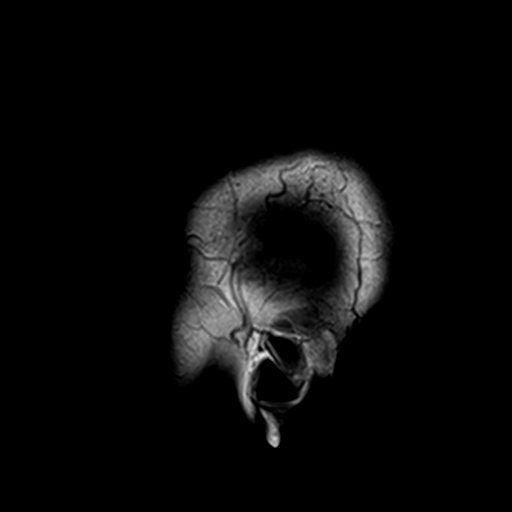
[im 11/21]
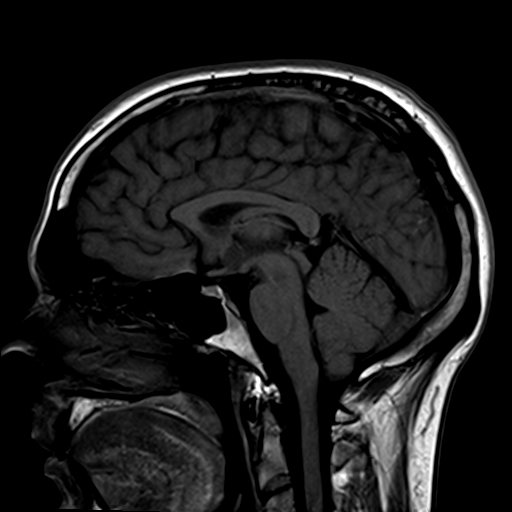
[im 21/21]
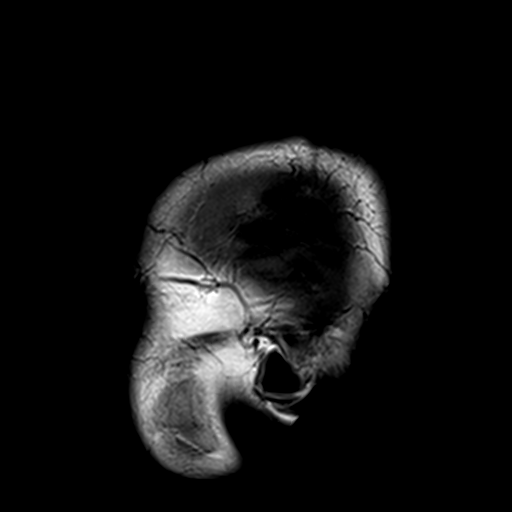

[Series 3: DWI · axial · 3.0mm · 1.80mm/px · z∈[-30,+117]mm · 9 of 100 slices shown (1 of 4)]
[im 1/100]
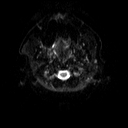
[im 13/100]
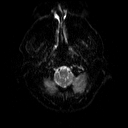
[im 25/100]
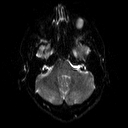
[im 38/100]
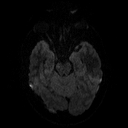
[im 50/100]
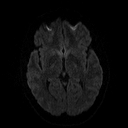
[im 62/100]
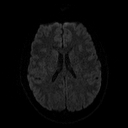
[im 75/100]
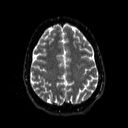
[im 87/100]
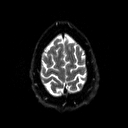
[im 100/100]
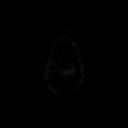

[Series 4: DWI · axial · 3.0mm · 1.80mm/px · z∈[-30,+117]mm · 4 of 48 slices shown (2 of 4)]
[im 1/48]
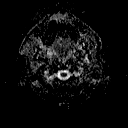
[im 16/48]
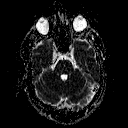
[im 32/48]
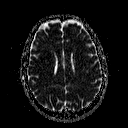
[im 48/48]
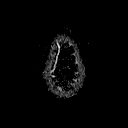

[Series 5: DWI · coronal · 5.0mm · 1.80mm/px · 6 of 72 slices shown (3 of 4)]
[im 1/72]
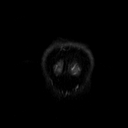
[im 15/72]
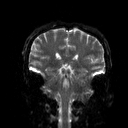
[im 29/72]
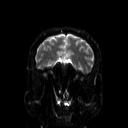
[im 43/72]
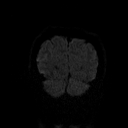
[im 57/72]
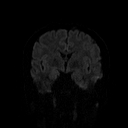
[im 72/72]
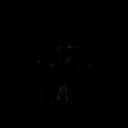

[Series 6: DWI · coronal · 5.0mm · 1.80mm/px · 3 of 36 slices shown (4 of 4)]
[im 1/36]
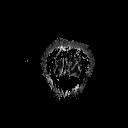
[im 18/36]
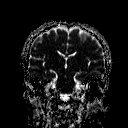
[im 36/36]
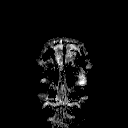

[Series 7: T2 · axial · 5.0mm · 0.51mm/px · z∈[-37,+117]mm · 2 of 23 slices shown (1 of 2)]
[im 1/23]
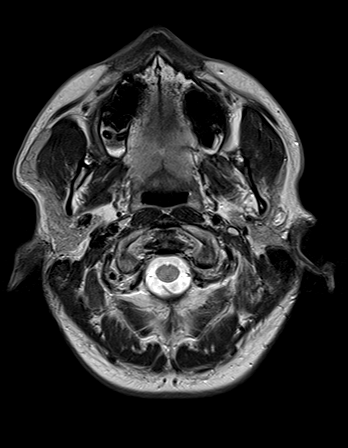
[im 23/23]
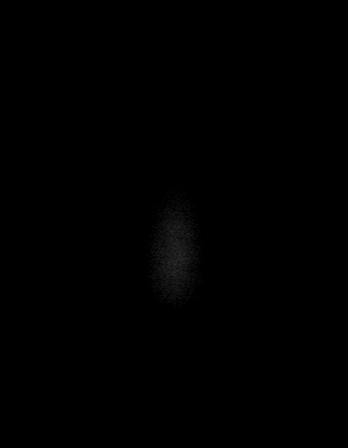

[Series 8: FLAIR · axial · 3.0mm · 0.45mm/px · z∈[-28,+107]mm · 3 of 30 slices shown]
[im 1/30]
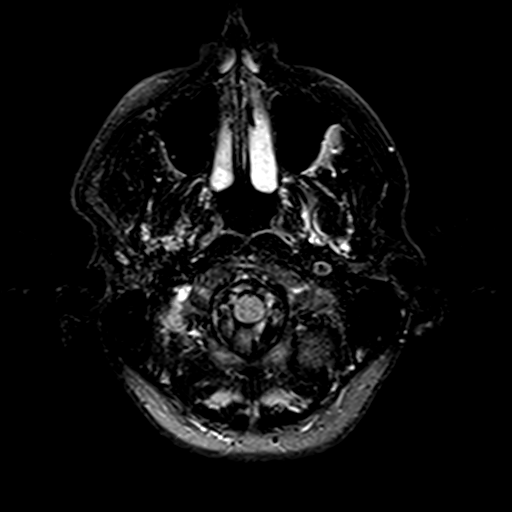
[im 15/30]
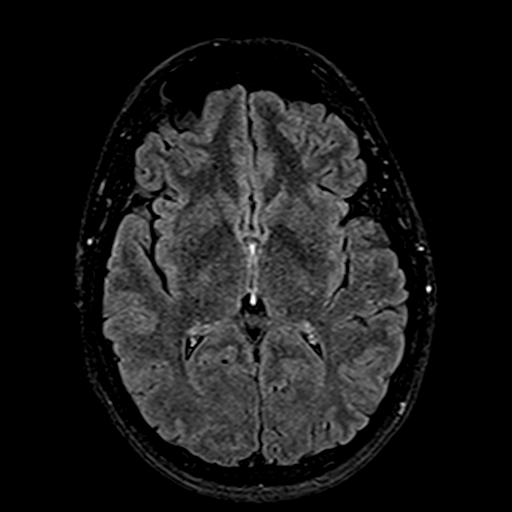
[im 30/30]
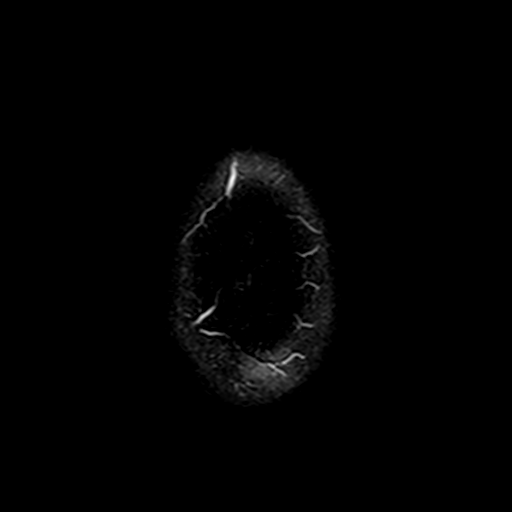

[Series 10: swi_images · axial · 4.0mm · 0.90mm/px · z∈[-30,+110]mm · 3 of 36 slices shown]
[im 1/36]
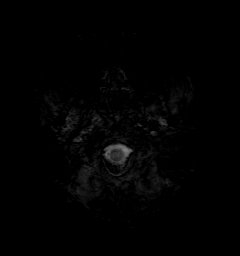
[im 18/36]
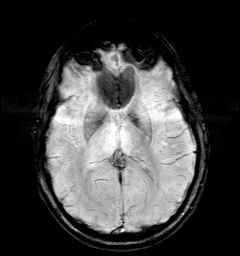
[im 36/36]
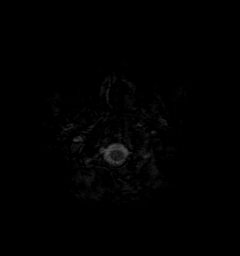

[Series 11: t1_mpr_tra · axial · 1.0mm · 0.71mm/px · z∈[-32,+111]mm · 13 of 144 slices shown]
[im 1/144]
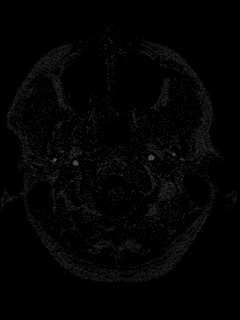
[im 12/144]
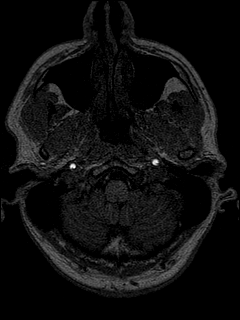
[im 24/144]
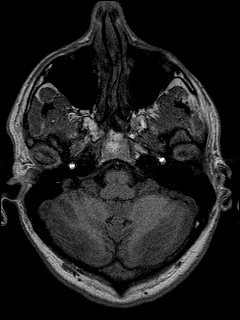
[im 36/144]
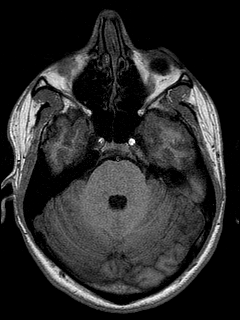
[im 48/144]
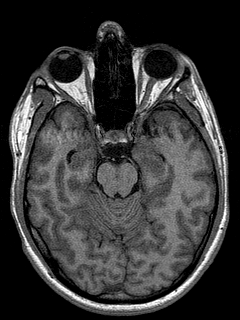
[im 60/144]
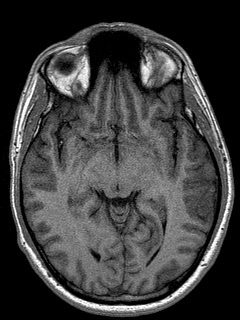
[im 72/144]
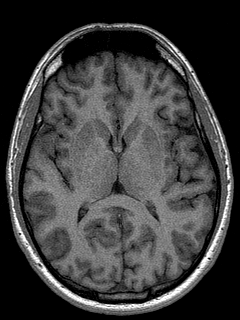
[im 84/144]
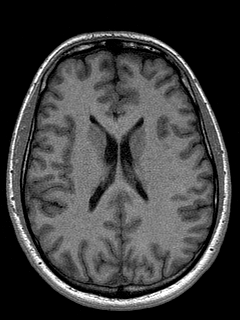
[im 96/144]
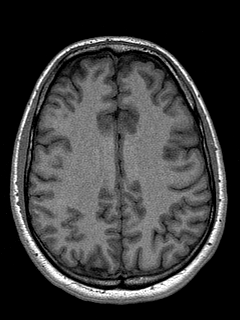
[im 108/144]
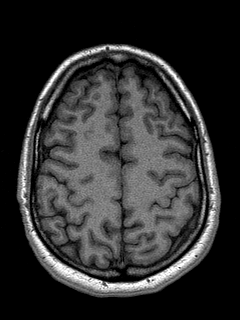
[im 120/144]
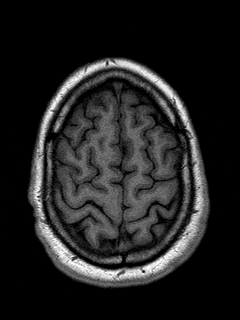
[im 132/144]
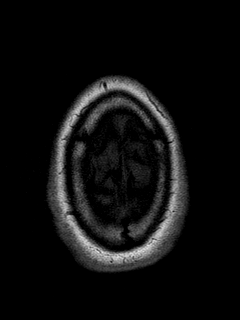
[im 144/144]
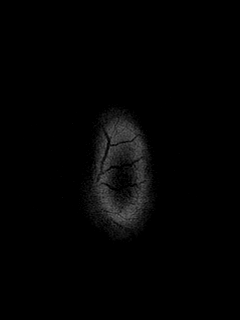

[Series 12: T2 · coronal · 5.0mm · 0.45mm/px · 2 of 28 slices shown (2 of 2)]
[im 1/28]
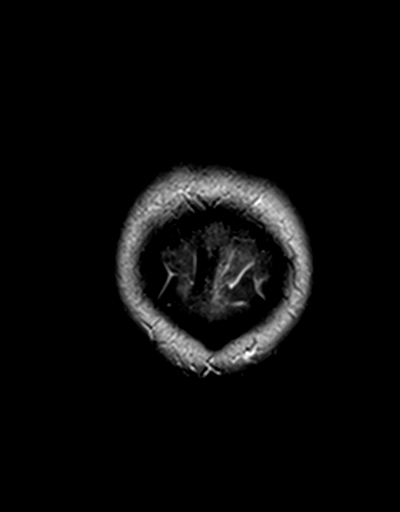
[im 28/28]
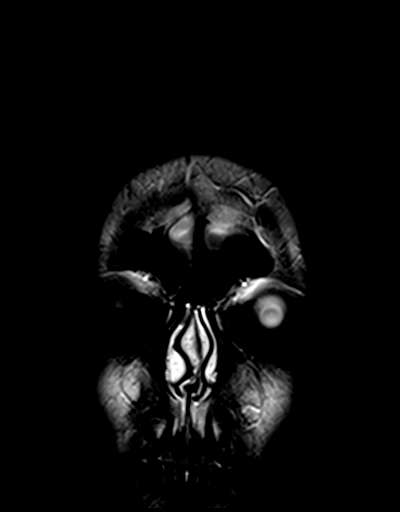

[48 of 48 positions shown; findings below may reference images not displayed]

FINDINGS: Brain:

Cerebral volume is normal.

Nonspecific 5 mm focus of T2 FLAIR hyperintense signal abnormality
within the left frontoparietal white matter (series 7, image 14).

No cortical encephalomalacia is identified.

There is no acute infarct.

No evidence of an intracranial mass.

No chronic intracranial blood products.

No extra-axial fluid collection.

No midline shift.

Vascular: Maintained flow voids within the proximal large arterial
vessels.

Skull and upper cervical spine: No focal suspicious marrow lesion.

Sinuses/Orbits: Visualized orbits show no acute finding. Mild
mucosal thickening within the left frontoethmoidal recess and
anterior left ethmoid air cells.
IMPRESSION: No evidence of acute intracranial abnormality.

Nonspecific 5 mm T2 FLAIR hyperintense remote insult within the left
frontoparietal white matter.

Otherwise unremarkable non-contrast MRI appearance of the brain.

Mild mucosal thickening within the left frontoethmoidal recess and
anterior left ethmoid air cells.

## 2021-09-18 ENCOUNTER — Other Ambulatory Visit: Payer: Self-pay

## 2021-09-18 ENCOUNTER — Encounter: Payer: Self-pay | Admitting: Dermatology

## 2021-09-18 ENCOUNTER — Ambulatory Visit (INDEPENDENT_AMBULATORY_CARE_PROVIDER_SITE_OTHER): Payer: Self-pay | Admitting: Dermatology

## 2021-09-18 DIAGNOSIS — I781 Nevus, non-neoplastic: Secondary | ICD-10-CM

## 2021-09-18 DIAGNOSIS — D229 Melanocytic nevi, unspecified: Secondary | ICD-10-CM

## 2021-09-18 DIAGNOSIS — D369 Benign neoplasm, unspecified site: Secondary | ICD-10-CM

## 2021-09-18 DIAGNOSIS — L578 Other skin changes due to chronic exposure to nonionizing radiation: Secondary | ICD-10-CM

## 2021-09-18 DIAGNOSIS — D224 Melanocytic nevi of scalp and neck: Secondary | ICD-10-CM

## 2021-09-18 DIAGNOSIS — D225 Melanocytic nevi of trunk: Secondary | ICD-10-CM

## 2021-09-18 NOTE — Progress Notes (Signed)
   New Patient Visit  Subjective  Jose Estes is a 30 y.o. male who presents for the following: Nevus (Patient with some moles at his back he would like checked and possibly removed. Sometimes itchy at back and one at scalp. ).  No hx of abnormal moles or skin cancer.   The following portions of the chart were reviewed this encounter and updated as appropriate:   Tobacco  Allergies  Meds  Problems  Med Hx  Surg Hx  Fam Hx      Review of Systems:  No other skin or systemic complaints except as noted in HPI or Assessment and Plan.  Objective  Well appearing patient in no apparent distress; mood and affect are within normal limits.  A focused examination was performed including face, scalp, back. Relevant physical exam findings are noted in the Assessment and Plan.  left parietal scalp, upper mid back 0.5 cm pink symmetric papule at left parietal scalp Upper mid back - 2 symmetric brown papules superior 0.9 cm, inferior 0.7 cm  nasal tip Smooth pink small 1-2 mm papules  nose Dilated blood vessels   Assessment & Plan  Nevus left parietal scalp, upper mid back  Benign-appearing.  Observation.  Call clinic for new or changing lesions.  Recommend daily use of broad spectrum spf 30+ sunscreen to sun-exposed areas.   Discussed removal options as they are sometimes symptomatic. Patient defers removal today. Also defers triamcinolone to use occasionally as needed for irritation.   Angiofibroma nasal tip  Benign-appearing.  Observation.  Call clinic for new or changing lesions.    Telangiectasia nose  Benign-appearing.  Observation.  Call clinic for new or changing lesions.  Recommend daily use of broad spectrum spf 30+ sunscreen to sun-exposed areas.    Actinic Damage - chronic, secondary to cumulative UV radiation exposure/sun exposure over time - diffuse scaly erythematous macules with underlying dyspigmentation - Recommend daily broad spectrum sunscreen SPF 30+  to sun-exposed areas, reapply every 2 hours as needed.  - Recommend staying in the shade or wearing long sleeves, sun glasses (UVA+UVB protection) and wide brim hats (4-inch brim around the entire circumference of the hat). - Call for new or changing lesions.  Return if symptoms worsen or fail to improve.  Graciella Belton, RMA, am acting as scribe for Forest Gleason, MD .   Documentation: I have reviewed the above documentation for accuracy and completeness, and I agree with the above.  Forest Gleason, MD

## 2021-09-18 NOTE — Patient Instructions (Signed)
Recommend taking Heliocare sun protection supplement daily in sunny weather for additional sun protection. For maximum protection on the sunniest days, you can take up to 2 capsules of regular Heliocare OR take 1 capsule of Heliocare Ultra. For prolonged exposure (such as a full day in the sun), you can repeat your dose of the supplement 4 hours after your first dose. Heliocare can be purchased at [facility] or at [email or web address].    Melanoma ABCDEs  Melanoma is the most dangerous type of skin cancer, and is the leading cause of death from skin disease.  You are more likely to develop melanoma if you: Have light-colored skin, light-colored eyes, or red or blond hair Spend a lot of time in the sun Tan regularly, either outdoors or in a tanning bed Have had blistering sunburns, especially during childhood Have a close family member who has had a melanoma Have atypical moles or large birthmarks  Early detection of melanoma is key since treatment is typically straightforward and cure rates are extremely high if we catch it early.   The first sign of melanoma is often a change in a mole or a new dark spot.  The ABCDE system is a way of remembering the signs of melanoma.  A for asymmetry:  The two halves do not match. B for border:  The edges of the growth are irregular. C for color:  A mixture of colors are present instead of an even brown color. D for diameter:  Melanomas are usually (but not always) greater than 44mm - the size of a pencil eraser. E for evolution:  The spot keeps changing in size, shape, and color.  Please check your skin once per month between visits. You can use a small mirror in front and a large mirror behind you to keep an eye on the back side or your body.   If you see any new or changing lesions before your next follow-up, please call to schedule a visit.  Please continue daily skin protection including broad spectrum sunscreen SPF 30+ to sun-exposed areas,  reapplying every 2 hours as needed when you're outdoors.    If You Need Anything After Your Visit  If you have any questions or concerns for your doctor, please call our main line at [phone] and press option 4 to reach your doctor's medical assistant. If no one answers, please leave a voicemail as directed and we will return your call as soon as possible. Messages left after 4 pm will be answered the following business day.   You may also send [place] a message via [place]. We typically respond to MyChart messages within 1-2 business days.  For prescription refills, please ask your pharmacy to contact our office. Our fax number is [phone].  If you have an urgent issue when the clinic is closed that cannot wait until the next business day, you can page your doctor at the number below.    Please note that while we do our best to be available for urgent issues outside of office hours, we are not available 24/7.   If you have an urgent issue and are unable to reach [place], you may choose to seek medical care at your doctor's office, retail clinic, urgent care center, or emergency room.  If you have a medical emergency, please immediately call 911 or go to the emergency department.  Pager Numbers  - Dr. Nehemiah Massed: [phone]  - Dr. Laurence Ferrari: [phone]  - Dr. Nicole Kindred: [phone]  In the event  of inclement weather, please call our main line at [phone] for an update on the status of any delays or closures.  Dermatology Medication Tips: Please keep the boxes that topical medications come in in order to help keep track of the instructions about where and how to use these. Pharmacies typically print the medication instructions only on the boxes and not directly on the medication tubes.   If your medication is too expensive, please contact our office at [phone] option 4 or send [place] a message through [employer].   We are unable to tell what your co-pay for medications will be in advance  as this is different depending on your insurance coverage. However, we may be able to find a substitute medication at lower cost or fill out paperwork to get insurance to cover a needed medication.   If a prior authorization is required to get your medication covered by your insurance company, please allow [place] 1-2 business days to complete this process.  Drug prices often vary depending on where the prescription is filled and some pharmacies may offer cheaper prices.  The website www.goodrx.com contains coupons for medications through different pharmacies. The prices here do not account for what the cost may be with help from insurance (it may be cheaper with your insurance), but the website can give you the price if you did not use any insurance.  - You can print the associated coupon and take it with your prescription to the pharmacy.  - You may also stop by our office during regular business hours and pick up a GoodRx coupon card.  - If you need your prescription sent electronically to a different pharmacy, notify our office through [facility] or by phone at [phone] option 4.     Si Usted Necesita Algo Despus de Su Visita  Tambin puede enviarnos un mensaje a travs de Pharmacist, community. Por lo general respondemos a los mensajes de MyChart en el transcurso de 1 a 2 das hbiles.  Para renovar recetas, por favor pida a su farmacia que se ponga en contacto con nuestra oficina. Harland Dingwall de fax es [place] [phone].  Si tiene un asunto urgente cuando la clnica est cerrada y que no puede esperar hasta el siguiente da hbil, puede llamar/localizar a su doctor(a) al nmero que aparece a continuacin.   Por favor, tenga en cuenta que aunque hacemos todo lo posible para estar disponibles para asuntos urgentes fuera del horario de [place], no estamos disponibles las 24 horas del da, los 7 das de la [place].   Si tiene un problema urgente y no puede comunicarse con nosotros, puede optar  por buscar atencin mdica  en el consultorio de su doctor(a), en una clnica privada, en un centro de atencin urgente o en una sala de emergencias.  Si tiene Engineering geologist, por favor llame inmediatamente al 911 o vaya a la sala de emergencias.  Nmeros de bper  - Dr. Nehemiah Massed: [phone]  - Dra. Moye: [phone]  - Dra. Nicole Kindred: [phone]  En caso de inclemencias del [place], por favor llame a Johnsie Kindred principal al [phone] para una actualizacin sobre el [place] de cualquier retraso o cierre.  Consejos para la medicacin en dermatologa: Por favor, guarde las cajas en las que vienen los medicamentos de uso tpico para ayudarle a seguir las instrucciones sobre dnde y cmo usarlos. Las farmacias generalmente imprimen las instrucciones del medicamento slo en las cajas y no directamente en los tubos del [place].   Si su medicamento  es muy caro, por favor, pngase en contacto con Zigmund Daniel llamando al [phone] y presione la opcin 4 o envenos un mensaje a travs de Pharmacist, community.   No podemos decirle cul ser su copago por los medicamentos por adelantado ya que esto es diferente dependiendo de la cobertura de su seguro. Sin embargo, es posible que podamos encontrar un medicamento sustituto a Electrical engineer un formulario para que el seguro cubra el medicamento que se considera necesario.   Si se requiere una autorizacin previa para que su compaa de seguros [place] su medicamento, por favor permtanos de 1 a 2 das hbiles para completar este proceso.  Los precios de los medicamentos varan con frecuencia dependiendo del Environmental consultant de dnde se surte la receta y alguna farmacias pueden ofrecer precios ms baratos.  El sitio web www.goodrx.com tiene cupones para medicamentos de Airline pilot. Los precios aqu no tienen en cuenta lo que podra costar con la ayuda del seguro (puede ser ms barato con su seguro), pero el sitio web puede darle el precio si  no utiliz Research scientist (physical sciences).  - Puede imprimir el cupn correspondiente y llevarlo con su receta a la farmacia.  - Tambin puede pasar por nuestra oficina durante el horario de atencin regular y Charity fundraiser una tarjeta de cupones de GoodRx.  - Si necesita que su receta se enve electrnicamente a una farmacia diferente, informe a nuestra oficina a travs de MyChart de [place] o por telfono llamando al [phone] y presione la opcin 4.

## 2021-09-20 ENCOUNTER — Other Ambulatory Visit: Payer: Self-pay

## 2021-09-30 ENCOUNTER — Ambulatory Visit: Payer: Self-pay | Admitting: Family Medicine

## 2021-09-30 ENCOUNTER — Telehealth: Payer: Self-pay

## 2021-09-30 NOTE — Telephone Encounter (Signed)
Called patient and lvmtcb

## 2021-09-30 NOTE — Telephone Encounter (Signed)
[place] Night - Client Nonclinical Telephone Record  [facility] Client [facility] Night - Client Client Site [place] Provider Ria Bush - MD Contact Type Call Who Is Calling Patient / Member / Family / Caregiver Caller Name Jakson Delpilar Caller Phone Number [phone] Patient Name Akshith Moncus Patient DOB May 09, 1991 Call Type Message Only Information Provided Reason for Call Request to Reschedule Office Appointment Initial Comment Caller states he needs to reschedule his appointment. Patient request to speak to RN No Additional Comment Provided office hours. Disp. Time Disposition Final User 09/30/2021 7:34:11 AM General Information Provided Yes Haynes Dage Call Closed By: Haynes Dage Transaction Date/Time: 09/30/2021 7:32:21 AM (ET

## 2021-09-30 NOTE — Telephone Encounter (Signed)
LMTCB to reschedule appt

## 2021-10-22 ENCOUNTER — Encounter: Payer: Self-pay | Admitting: Family Medicine

## 2021-10-22 ENCOUNTER — Other Ambulatory Visit: Payer: Self-pay

## 2021-10-22 ENCOUNTER — Ambulatory Visit (INDEPENDENT_AMBULATORY_CARE_PROVIDER_SITE_OTHER): Payer: Self-pay | Admitting: Family Medicine

## 2021-10-22 VITALS — BP 116/72 | HR 88 | Temp 97.5°F | Ht 71.0 in | Wt 181.4 lb

## 2021-10-22 DIAGNOSIS — R229 Localized swelling, mass and lump, unspecified: Secondary | ICD-10-CM

## 2021-10-22 DIAGNOSIS — F411 Generalized anxiety disorder: Secondary | ICD-10-CM

## 2021-10-22 NOTE — Assessment & Plan Note (Signed)
Stable period with healthy diet and lifestyle changes, now off medication.

## 2021-10-22 NOTE — Progress Notes (Signed)
Patient ID: Jose Estes, male    DOB: 1991-09-02, 30 y.o.   MRN: [id number]  This visit was conducted in person.  BP 116/72    Pulse 88    Temp (!) 97.5 F (36.4 C) (Temporal)    Ht 5\' 11"  (1.803 m)    Wt 181 lb 7 oz (82.3 kg)    SpO2 98%    BMI 25.31 kg/m    CC: lumps R thigh Subjective:   HPI: Jose Estes is a 30 y.o. male presenting on 10/22/2021 for Mass (C/o lumps on anterior right thigh.  Noticed about 2 yrs ago.  Recently noticed a new one. )   Overall doing well. Did not continue lexapro or xanax. Lexapro may have caused difficulty sleeping. Instead made diet changes - exercising daily, more natural diet (fruits/vegetables) and has cut down on sugars. Continues magnesium and fish oil, Started new supplements of zinc and vit B12 and lemon balm (tea), stinging nettle.   New lumps to proximal anterior R thigh. Had lump present for years, now new second lump noted. Not tender, no skin changes, no inciting trauma/injury or falls.      Relevant past medical, surgical, family and social history reviewed and updated as indicated. Interim medical history since our last visit reviewed. Allergies and medications reviewed and updated. Outpatient Medications Prior to Visit  Medication Sig Dispense Refill   Cyanocobalamin (VITAMIN B-12) 2500 MCG SUBL Place 1 tablet under the tongue daily.     MAGNESIUM PO Take 400 mg by mouth at bedtime. Powder     Multiple Vitamins-Minerals (ZINC PO) Take by mouth daily.     Omega-3 Fatty Acids (FISH OIL PO) Take by mouth at bedtime.     ALPRAZolam (XANAX) 0.25 MG tablet Take 1 tablet (0.25 mg total) by mouth 2 (two) times daily as needed for anxiety. 20 tablet 0   escitalopram (LEXAPRO) 10 MG tablet Take 1 tablet (10 mg total) by mouth daily. 30 tablet 6   No facility-administered medications prior to visit.     Per HPI unless specifically indicated in ROS section below Review of Systems  Objective:  BP 116/72    Pulse 88    Temp (!) 97.5  F (36.4 C) (Temporal)    Ht 5\' 11"  (1.803 m)    Wt 181 lb 7 oz (82.3 kg)    SpO2 98%    BMI 25.31 kg/m   Wt Readings from Last 3 Encounters:  10/22/21 181 lb 7 oz (82.3 kg)  08/08/21 176 lb (79.8 kg)  05/14/21 190 lb (86.2 kg)      Physical Exam Vitals and nursing note reviewed.  Constitutional:      Appearance: Normal appearance. He is not ill-appearing.  Lymphadenopathy:     Lower Body: No right inguinal adenopathy. No left inguinal adenopathy.  Skin:    General: Skin is warm and dry.     Findings: No erythema or rash.          Comments: 2 small firm mobile subcm lumps to proximal anterior thigh, nontender  Neurological:     Mental Status: He is alert.  Psychiatric:        Mood and Affect: Mood normal.        Behavior: Behavior normal.       Assessment & Plan:  This visit occurred during the SARS-CoV-2 public health emergency.  Safety protocols were in place, including screening questions prior to the visit, additional usage of staff PPE, and extensive  cleaning of exam room while observing appropriate contact time as indicated for disinfecting solutions.   Problem List Items Addressed This Visit     GAD (generalized anxiety disorder)    Stable period with healthy diet and lifestyle changes, now off medication.       Skin nodule - Primary    2 small benign feeling lumps to R anterior thigh - anticipate lipomas. Reassurance provided. Update if changing ,enlarging, or if more develop.         No orders of the defined types were placed in this encounter.  No orders of the defined types were placed in this encounter.    Patient instructions: Small nodules possibly lipomas which are benign fatty tumors, vs scarred nodule.   Follow up plan: Return if symptoms worsen or fail to improve.  Ria Bush, MD

## 2021-10-22 NOTE — Assessment & Plan Note (Signed)
2 small benign feeling lumps to R anterior thigh - anticipate lipomas. Reassurance provided. Update if changing ,enlarging, or if more develop.

## 2021-10-22 NOTE — Patient Instructions (Signed)
Small nodules possibly lipomas which are benign fatty tumors, vs scarred nodule.   Lipoma A lipoma is a noncancerous (benign) tumor that is made up of fat cells. This is a very common type of soft-tissue growth. Lipomas are usually found under the skin (subcutaneous). They may occur in any tissue of the body that contains fat. Common areas for lipomas to appear include the back, arms, shoulders, buttocks, and thighs. Lipomas grow slowly, and they are usually painless. Most lipomas do not cause problems and do not require treatment. What are the causes? The cause of this condition is not known. What increases the risk? You are more likely to develop this condition if: You are 65-83 years old. You have a family history of lipomas. What are the signs or symptoms? A lipoma usually appears as a small, round bump under the skin. In most cases, the lump will: Feel soft or rubbery. Not cause pain or other symptoms. However, if a lipoma is located in an area where it pushes on nerves, it can become painful or cause other symptoms. How is this diagnosed? A lipoma can usually be diagnosed with a physical exam. You may also have tests to confirm the diagnosis and to rule out other conditions. Tests may include: Imaging tests, such as a CT scan or an MRI. Removal of a tissue sample to be looked at under a microscope (biopsy). How is this treated? Treatment for this condition depends on the size of the lipoma and whether it is causing any symptoms. For small lipomas that are not causing problems, no treatment is needed. If a lipoma is bigger or it causes problems, surgery may be done to remove the lipoma. Lipomas can also be removed to improve appearance. Most often, the procedure is done after applying a medicine that numbs the area (local anesthetic). Liposuction may be done to reduce the size of the lipoma before it is removed through surgery, or it may be done to remove the lipoma. Lipomas are removed  with this method in order to limit incision size and scarring. A liposuction tube is inserted through a small incision into the lipoma, and the contents of the lipoma are removed through the tube with suction. Follow these instructions at home: Watch your lipoma for any changes. Keep all follow-up visits as told by your health care provider. This is important. Contact a health care provider if: Your lipoma becomes larger or hard. Your lipoma becomes painful, red, or increasingly swollen. These could be signs of infection or a more serious condition. Get help right away if: You develop tingling or numbness in an area near the lipoma. This could indicate that the lipoma is causing nerve damage. Summary A lipoma is a noncancerous tumor that is made up of fat cells. Most lipomas do not cause problems and do not require treatment. If a lipoma is bigger or it causes problems, surgery may be done to remove the lipoma. Contact a health care provider if your lipoma becomes larger or hard, or if it becomes painful, red, or increasingly swollen. Pain, redness, and swelling could be signs of infection or a more serious condition. This information is not intended to replace advice given to you by your health care provider. Make sure you discuss any questions you have with your health care provider. Document Revised: 06/05/2019 Document Reviewed: 06/05/2019 Elsevier Patient Education  [place].

## 2022-02-06 ENCOUNTER — Encounter: Payer: Self-pay | Admitting: Family Medicine

## 2022-02-17 ENCOUNTER — Ambulatory Visit (INDEPENDENT_AMBULATORY_CARE_PROVIDER_SITE_OTHER): Payer: BC Managed Care – PPO | Admitting: Family Medicine

## 2022-02-17 ENCOUNTER — Encounter: Payer: Self-pay | Admitting: Family Medicine

## 2022-02-17 VITALS — BP 118/64 | HR 75 | Temp 97.7°F | Ht 71.0 in | Wt 180.1 lb

## 2022-02-17 DIAGNOSIS — F411 Generalized anxiety disorder: Secondary | ICD-10-CM

## 2022-02-17 DIAGNOSIS — L659 Nonscarring hair loss, unspecified: Secondary | ICD-10-CM

## 2022-02-17 DIAGNOSIS — Z0000 Encounter for general adult medical examination without abnormal findings: Secondary | ICD-10-CM | POA: Diagnosis not present

## 2022-02-17 LAB — FERRITIN: Ferritin: 131.8 ng/mL (ref 22.0–322.0)

## 2022-02-17 LAB — BASIC METABOLIC PANEL
BUN: 16 mg/dL (ref 6–23)
CO2: 30 mEq/L (ref 19–32)
Calcium: 9.6 mg/dL (ref 8.4–10.5)
Chloride: 103 mEq/L (ref 96–112)
Creatinine, Ser: 0.93 mg/dL (ref 0.40–1.50)
GFR: 109.67 mL/min (ref >60.00)
Glucose, Bld: 92 mg/dL (ref 70–99)
Potassium: 4.3 mEq/L (ref 3.5–5.1)
Sodium: 140 mEq/L (ref 135–145)

## 2022-02-17 LAB — VITAMIN D 25 HYDROXY (VIT D DEFICIENCY, FRACTURES): VITD: 27.42 ng/mL — ABNORMAL LOW (ref 30.00–100.00)

## 2022-02-17 MED ORDER — ZINC GLUCONATE 50 MG PO TABS: 50.0000 mg | ORAL_TABLET | Freq: Every day | ORAL | Status: DC

## 2022-02-17 NOTE — Assessment & Plan Note (Signed)
Stable period with healthy diet and lifestyle. Implementing healthy stress relieving strategies.  ?

## 2022-02-17 NOTE — Progress Notes (Signed)
? ? Patient ID: Jose Estes, male    DOB: [birth year], 31 y.o.   MRN: [id number] ? ?This visit was conducted in person. ? ?BP 118/64   Pulse 75   Temp 97.7 ?F (36.5 ?C) (Temporal)   Ht '5\' 11"'$  (1.803 m)   Wt 180 lb 2 oz (81.7 kg)   SpO2 99%   BMI 25.12 kg/m?   ? ?CC: CPE ?Subjective:  ? ?HPI: ?Jose Estes is a 31 y.o. male presenting on 02/17/2022 for Annual Exam ? ? ?Anxiety - stable period off medication, managing with health diet, regular exercise, decreased sugar intake. notes difficulty with focus.  ? ?Notes thinning hair - did have significant stress over the summer. 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  ? ?Intermittent fasting 16:8.  ? ?COVID infection 10/2020.  ?Monogamous relationship.  ? ?Preventative: ?Flu shot - declined ?COVID vaccine - declined  ?Tdap 04/2019 ?Seat belt use discussed.  ?Sunscreen use discussed. Wants moles checked  ?Sleep - averaging 8-9 hours/night ?Ex smoker - fully quit E cigs 01/2019  ?Alcohol - social  ?Rec drugs - none ?Dentist q6 mo ?Eye exam - yearly - wears blue light glasses ? 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   ? ?Relevant past medical, surgical, family and social history reviewed and updated as indicated. Interim medical history since our last visit reviewed. ?Allergies and medications reviewed and updated. ?Outpatient Medications Prior to Visit  ?Medication Sig Dispense Refill  ? Cyanocobalamin (VITAMIN B-12) 2500 MCG SUBL Place 1 tablet under the tongue daily.    ? MAGNESIUM PO Take 400 mg by mouth at bedtime. Powder    ? Omega-3 Fatty Acids (FISH OIL PO) Take by mouth at bedtime.    ? Multiple Vitamins-Minerals (ZINC PO) Take by mouth daily.    ? ?No facility-administered medications prior to visit.  ?  ? ?Per HPI unless  specifically indicated in ROS section below ?Review of Systems  ?Constitutional:  Negative for activity change, appetite change, chills, fatigue, fever and unexpected weight change.  ?HENT:  Negative for hearing loss.   ?Eyes:  Negative for visual disturbance.  ?Respiratory:  Positive for wheezing (a few weeks ago). Negative for cough, chest tightness and shortness of breath.   ?Cardiovascular:  Negative for chest pain, palpitations and leg swelling.  ?Gastrointestinal:  Positive for nausea (occ). Negative for abdominal distention, abdominal pain, blood in stool, constipation, diarrhea and vomiting.  ?Genitourinary:  Negative for difficulty urinating and hematuria.  ?Musculoskeletal:  Negative for arthralgias, myalgias and neck pain.  ?Skin:  Negative for rash.  ?Neurological:  Positive for dizziness (?dehydration related). Negative for seizures, syncope and headaches.  ?Hematological:  Negative for adenopathy. Does not bruise/bleed easily.  ?Psychiatric/Behavioral:  Negative for dysphoric mood. The patient is not nervous/anxious.   ? ?Objective:  ?BP 118/64   Pulse 75   Temp 97.7 ?F (36.5 ?C) (Temporal)   Ht '5\' 11"'$  (1.803 m)   Wt 180 lb 2 oz (81.7 kg)   SpO2 99%   BMI 25.12 kg/m?   ?Wt Readings from Last 3 Encounters:  ?02/17/22 180 lb 2 oz (81.7 kg)  ?10/22/21 181 lb 7 oz (82.3 kg)  ?08/08/21 176 lb (79.8 kg)  ?  ?  ?Physical Exam ?Vitals and nursing note reviewed.  ?Constitutional:   ?  General: He is not in acute distress. ?   Appearance: Normal appearance. He is well-developed. He is not ill-appearing.  ?HENT:  ?   Head: Normocephalic and atraumatic.  ?   Right Ear: Hearing, tympanic membrane, ear canal and external ear normal.  ?   Left Ear: Hearing, tympanic membrane, ear canal and external ear normal.  ?Eyes:  ?   General: No scleral icterus. ?   Extraocular Movements: Extraocular movements intact.  ?   Conjunctiva/sclera: Conjunctivae normal.  ?   Pupils: Pupils are equal, round, and reactive to  light.  ?Neck:  ?   Thyroid: No thyroid mass or thyromegaly.  ?Cardiovascular:  ?   Rate and Rhythm: Normal rate and regular rhythm.  ?   Pulses: Normal pulses.     ?     Radial pulses are 2+ on the right side and 2+ on the left side.  ?   Heart sounds: Normal heart sounds. No murmur heard. ?Pulmonary:  ?   Effort: Pulmonary effort is normal. No respiratory distress.  ?   Breath sounds: Normal breath sounds. No wheezing, rhonchi or rales.  ?Abdominal:  ?   General: Bowel sounds are normal. There is no distension.  ?   Palpations: Abdomen is soft. There is no mass.  ?   Tenderness: There is no abdominal tenderness. There is no guarding or rebound.  ?   Hernia: No hernia is present.  ?Musculoskeletal:     ?   General: Normal range of motion.  ?   Cervical back: Normal range of motion and neck supple.  ?   Right lower leg: No edema.  ?   Left lower leg: No edema.  ?Lymphadenopathy:  ?   Cervical: No cervical adenopathy.  ?Skin: ?   General: Skin is warm and dry.  ?   Findings: No rash.  ?Neurological:  ?   General: No focal deficit present.  ?   Mental Status: He is alert and oriented to person, place, and time.  ?Psychiatric:     ?   Mood and Affect: Mood normal.     ?   Behavior: Behavior normal.     ?   Thought Content: Thought content normal.     ?   Judgment: Judgment normal.  ? ?   ?Results for orders placed or performed in visit on 08/08/21  ?TSH  ?Result Value Ref Range  ? TSH 1.82 0.35 - 5.50 uIU/mL  ?CBC with Differential/Platelet  ?Result Value Ref Range  ? WBC 6.1 4.0 - 10.5 K/uL  ? RBC 5.18 4.22 - 5.81 Mil/uL  ? Hemoglobin 15.1 13.0 - 17.0 g/dL  ? HCT 44.7 39.0 - 52.0 %  ? MCV 86.3 78.0 - 100.0 fl  ? MCHC 33.8 30.0 - 36.0 g/dL  ? RDW 13.3 11.5 - 15.5 %  ? Platelets 252.0 150.0 - 400.0 K/uL  ? Neutrophils Relative % 62.2 43.0 - 77.0 %  ? Lymphocytes Relative 29.4 12.0 - 46.0 %  ? Monocytes Relative 6.7 3.0 - 12.0 %  ? Eosinophils Relative 1.0 0.0 - 5.0 %  ? Basophils Relative 0.7 0.0 - 3.0 %  ? Neutro Abs  3.8 1.4 - 7.7 K/uL  ? Lymphs Abs 1.8 0.7 - 4.0 K/uL  ? Monocytes Absolute 0.4 0.1 - 1.0 K/uL  ? Eosinophils Absolute 0.1 0.0 - 0.7 K/uL  ? Basophils Absolute 0.0 0.0 - 0.1 K/uL  ? ? ?Assessment & Plan:  ? ?Problem List Items Addressed This  Visit   ? ? Health maintenance examination - Primary (Chronic)  ?  Preventative protocols reviewed and updated unless pt declined. ?Discussed healthy diet and lifestyle.  ? ?  ?  ? Relevant Orders  ? Basic metabolic panel  ? GAD (generalized anxiety disorder)  ?  Stable period with healthy diet and lifestyle. Implementing healthy stress relieving strategies.  ? ?  ?  ? Thinning hair  ?  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  ?No alopecia areata or scarring hair loss.  ?Discussed etiology of both as well as management strategies including topical minoxidil, finasteride, saw palmetto, biotin, shampoo choice.  ?Recent TSH normal. Check ferritin. ?  ?  ? Relevant Orders  ? Ferritin  ? VITAMIN D 25 Hydroxy (Vit-D Deficiency, Fractures)  ?  ? ?Meds ordered this encounter  ?Medications  ? zinc gluconate 50 MG tablet  ?  Sig: Take 1 tablet (50 mg total) by mouth daily. TINCTURE OF ZINC  ? ?Orders Placed This Encounter  ?Procedures  ? Basic metabolic panel  ? Ferritin  ? VITAMIN D 25 Hydroxy (Vit-D Deficiency, Fractures)  ? ? ?Patient instructions: ?Labs today  ?Good to see you today ?Return as needed or in 1-2 years for next physical.  ? ?Follow up plan: ?Return in about 1 year (around 02/18/2023) for annual exam, prior fasting for blood work. ? ?Ria Bush, MD   ?

## 2022-02-17 NOTE — Assessment & Plan Note (Addendum)
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  ?No alopecia areata or scarring hair loss.  ?Discussed etiology of both as well as management strategies including topical minoxidil, finasteride, saw palmetto, biotin, shampoo choice.  ?Recent TSH normal. Check ferritin. ?

## 2022-02-17 NOTE — Patient Instructions (Addendum)
Labs today  ?Good to see you today ?Return as needed or in 1-2 years for next physical.  ? ?Health Maintenance, Male ?Adopting a healthy lifestyle and getting preventive care are important in promoting health and wellness. Ask your health care provider about: ?The right schedule for you to have regular tests and exams. ?Things you can do on your own to prevent diseases and keep yourself healthy. ?What should I know about diet, weight, and exercise? ?Eat a healthy diet ? ?Eat a diet that includes plenty of vegetables, fruits, low-fat dairy products, and lean protein. ?Do not eat a lot of foods that are high in solid fats, added sugars, or sodium. ?Maintain a healthy weight ?Body mass index (BMI) is a measurement that can be used to identify possible weight problems. It estimates body fat based on height and weight. Your health care provider can help determine your BMI and help you achieve or maintain a healthy weight. ?Get regular exercise ?Get regular exercise. This is one of the most important things you can do for your health. Most adults should: ?Exercise for at least 150 minutes each week. The exercise should increase your heart rate and make you sweat (moderate-intensity exercise). ?Do strengthening exercises at least twice a week. This is in addition to the moderate-intensity exercise. ?Spend less time sitting. Even light physical activity can be beneficial. ?Watch cholesterol and blood lipids ?Have your blood tested for lipids and cholesterol at 31 years of age, then have this test every 5 years. ?You may need to have your cholesterol levels checked more often if: ?Your lipid or cholesterol levels are high. ?You are older than 31 years of age. ?You are at high risk for heart disease. ?What should I know about cancer screening? ?Many types of cancers can be detected early and may often be prevented. Depending on your health history and family history, you may need to have cancer screening at various ages. This  may include screening for: ?Colorectal cancer. ?Prostate cancer. ?Skin cancer. ?Lung cancer. ?What should I know about heart disease, diabetes, and high blood pressure? ?Blood pressure and heart disease ?High blood pressure causes heart disease and increases the risk of stroke. This is more likely to develop in people who have high blood pressure readings or are overweight. ?Talk with your health care provider about your target blood pressure readings. ?Have your blood pressure checked: ?Every 3-5 years if you are 38-55 years of age. ?Every year if you are 3 years old or older. ?If you are between the ages of 64 and 68 and are a current or former smoker, ask your health care provider if you should have a one-time screening for abdominal aortic aneurysm (AAA). ?Diabetes ?Have regular diabetes screenings. This checks your fasting blood sugar level. Have the screening done: ?Once every three years after age 65 if you are at a normal weight and have a low risk for diabetes. ?More often and at a younger age if you are overweight or have a high risk for diabetes. ?What should I know about preventing infection? ?Hepatitis B ?If you have a higher risk for hepatitis B, you should be screened for this virus. Talk with your health care provider to find out if you are at risk for hepatitis B infection. ?Hepatitis C ?Blood testing is recommended for: ?Everyone born from [birth year] through 1965. ?Anyone with known risk factors for hepatitis C. ?Sexually transmitted infections (STIs) ?You should be screened each year for STIs, including gonorrhea and chlamydia, if: ?You are  sexually active and are younger than 31 years of age. ?You are older than 31 years of age and your health care provider tells you that you are at risk for this type of infection. ?Your sexual activity has changed since you were last screened, and you are at increased risk for chlamydia or gonorrhea. Ask your health care provider if you are at risk. ?Ask your health  care provider about whether you are at high risk for HIV. Your health care provider may recommend a prescription medicine to help prevent HIV infection. If you choose to take medicine to prevent HIV, you should first get tested for HIV. You should then be tested every 3 months for as long as you are taking the medicine. ?Follow these instructions at home: ?Alcohol use ?Do not drink alcohol if your health care provider tells you not to drink. ?If you drink alcohol: ?Limit how much you have to 0-2 drinks a day. ?Know how much alcohol is in your drink. In the [place] one drink equals one 12 oz bottle of beer (355 mL), one 5 oz glass of wine (148 mL), or one 1? oz glass of hard liquor (44 mL). ?Lifestyle ?Do not use any products that contain nicotine or tobacco. These products include cigarettes, chewing tobacco, and vaping devices, such as e-cigarettes. If you need help quitting, ask your health care provider. ?Do not use street drugs. ?Do not share needles. ?Ask your health care provider for help if you need support or information about quitting drugs. ?General instructions ?Schedule regular health, dental, and eye exams. ?Stay current with your vaccines. ?Tell your health care provider if: ?You often feel depressed. ?You have ever been abused or do not feel safe at home. ?Summary ?Adopting a healthy lifestyle and getting preventive care are important in promoting health and wellness. ?Follow your health care provider's instructions about healthy diet, exercising, and getting tested or screened for diseases. ?Follow your health care provider's instructions on monitoring your cholesterol and blood pressure. ?This information is not intended to replace advice given to you by your health care provider. Make sure you discuss any questions you have with your health care provider. ?Document Revised: 03/10/2021 Document Reviewed: 03/10/2021 ?Elsevier Patient Education ? [employer]. ? ?

## 2022-02-17 NOTE — Assessment & Plan Note (Signed)
Preventative protocols reviewed and updated unless pt declined. Discussed healthy diet and lifestyle.  

## 2022-02-18 ENCOUNTER — Other Ambulatory Visit: Payer: Self-pay | Admitting: Family Medicine

## 2022-02-18 MED ORDER — VITAMIN D3 25 MCG (1000 UT) PO CAPS: 1.0000 | ORAL_CAPSULE | Freq: Every day | ORAL | Status: DC

## 2022-02-27 ENCOUNTER — Encounter: Payer: Self-pay | Admitting: Emergency Medicine

## 2022-02-27 ENCOUNTER — Ambulatory Visit
Admission: EM | Admit: 2022-02-27 | Discharge: 2022-02-27 | Disposition: A | Discharge status: Home / Self Care | Payer: BC Managed Care – PPO | Attending: Family Medicine | Admitting: Family Medicine

## 2022-02-27 DIAGNOSIS — R109 Unspecified abdominal pain: Secondary | ICD-10-CM | POA: Diagnosis not present

## 2022-02-27 DIAGNOSIS — R1084 Generalized abdominal pain: Secondary | ICD-10-CM | POA: Diagnosis not present

## 2022-02-27 MED ORDER — FAMOTIDINE 20 MG PO TABS: 20.0000 mg | ORAL_TABLET | Freq: Two times a day (BID) | ORAL | 0 refills | Status: DC | PRN

## 2022-02-27 NOTE — ED Triage Notes (Signed)
Pt here with generalized abdominal pain and cramping x 2 weeks.States it moves around and is intermittent. States stools are slightly lose as compared to previous stools. One stool last week was very light gray, but now it is back to normal. Pt states no significant nausea is associated with this pain.  ?

## 2022-02-27 NOTE — Discharge Instructions (Addendum)
Recommend a trial famotidine once to twice  daily for 5 days to reduce any excessive bowel acid that may be contributing to abdominal pain.  ?If symptoms persist discuss with PCP the need for further diagnostic work-up/imaging.  ? ?If your symptoms worsens or become severe go immediately to the Emergency Department. ?

## 2022-02-27 NOTE — ED Provider Notes (Signed)
?[facility] ? ? ? ?CSN: [id number] ?Arrival date & time: 02/27/22  1325 ? ? ?  ? ?History   ?Chief Complaint ?Chief Complaint  ?Patient presents with  ? Abdominal Pain  ? ? ?HPI ?Jose Estes is a 31 y.o. male.  ? ?HPI ?Patient presents today with a two week history of generalized abdominal pain and abdominal cramping. He has a distant history of GERD although doesn't take any medication for symptoms. He reports normal bowel movements with stools slightly looser than baseline although non-diarrheal. He is afebrile. Denies any changes in dietary habits.  ?Past Medical History:  ?Diagnosis Date  ? Depression 2012  ? Paronychia of left thumb 2015  ? ? ?Patient Active Problem List  ? Diagnosis Date Noted  ? Thinning hair 02/17/2022  ? Skin nodule 10/22/2021  ? Testicle pain 05/06/2021  ? Difficulty swallowing pills 04/28/2021  ? Health maintenance examination 05/25/2016  ? GAD (generalized anxiety disorder) 05/25/2016  ? ? ?Past Surgical History:  ?Procedure Laterality Date  ? TONSILLECTOMY  2011  ? ? ? ? ? ?Home Medications   ? ?Prior to Admission medications   ?Medication Sig Start Date End Date Taking? Authorizing Provider  ?famotidine (PEPCID) 20 MG tablet Take 1 tablet (20 mg total) by mouth 2 (two) times daily as needed for up to 5 days for heartburn or indigestion. 02/27/22 03/04/22 Yes Scot Jun, FNP  ?Cholecalciferol (VITAMIN D3) 25 MCG (1000 UT) CAPS Take 1 capsule (1,000 Units total) by mouth daily. 02/18/22   Ria Bush, MD  ?Cyanocobalamin (VITAMIN B-12) 2500 MCG SUBL Place 1 tablet under the tongue daily.    [provider]  ?MAGNESIUM PO Take 400 mg by mouth at bedtime. Powder    [provider]  ?Omega-3 Fatty Acids (FISH OIL PO) Take by mouth at bedtime.    [provider]  ?zinc gluconate 50 MG tablet Take 1 tablet (50 mg total) by mouth daily. TINCTURE OF ZINC 02/17/22   Ria Bush, MD  ? ? ?Family History ?Family History  ?Problem Relation Age  of Onset  ? Anxiety disorder Mother   ? Depression Mother   ? Hypertension Father   ? Anxiety disorder Maternal Grandmother   ? Arthritis Maternal Grandmother   ? CAD Neg Hx   ? Stroke Neg Hx   ? Diabetes Neg Hx   ? Cancer Neg Hx   ? ? ?Social History ?Social History  ? ?Tobacco Use  ? Smoking status: Former  ?  Types: E-cigarettes  ?  Quit date: 04/02/2016  ?  Years since quitting: 5.9  ? Smokeless tobacco: Never  ?Substance Use Topics  ? Alcohol use: Yes  ?  Comment: social  ? Drug use: Yes  ?  Types: Marijuana  ? ? ? ?Allergies   ?Patient has no known allergies. ? ? ?Review of Systems ?Review of Systems ?Pertinent negatives listed in HPI  ? ?Physical Exam ?Triage Vital Signs ?ED Triage Vitals [02/27/22 1335]  ?Enc Vitals Group  ?   BP 131/80  ?   Pulse Rate 87  ?   Resp 16  ?   Temp 98.9 ?F (37.2 ?C)  ?   Temp Source Oral  ?   SpO2 99 %  ?   Weight   ?   Height   ?   Head Circumference   ?   Peak Flow   ?   Pain Score   ?   Pain Loc   ?  Pain Edu?   ?   Excl. in [place]?   ? ?No data found. ? ?Updated Vital Signs ?BP 131/80 (BP Location: Left Arm)   Pulse 87   Temp 98.9 ?F (37.2 ?C) (Oral)   Resp 16   SpO2 99%  ? ?Visual Acuity ?Right Eye Distance:   ?Left Eye Distance:   ?Bilateral Distance:   ? ?Right Eye Near:   ?Left Eye Near:    ?Bilateral Near:    ? ?Physical Exam ?Constitutional:   ?   Appearance: He is well-developed. He is not ill-appearing.  ?HENT:  ?   Head: Normocephalic and atraumatic.  ?Cardiovascular:  ?   Rate and Rhythm: Normal rate and regular rhythm.  ?Abdominal:  ?   General: Bowel sounds are increased.  ?   Palpations: Abdomen is rigid.  ?   Tenderness: There is abdominal tenderness in the epigastric area. There is no right CVA tenderness or left CVA tenderness.  ?Skin: ?   General: Skin is warm and dry.  ?Neurological:  ?   General: No focal deficit present.  ?   Mental Status: He is alert.  ?Psychiatric:     ?   Mood and Affect: Mood normal.     ?   Behavior: Behavior normal.  ? ? ? ?UC  Treatments / Results  ?Labs ?(all labs ordered are listed, but only abnormal results are displayed) ?Labs Reviewed - No data to display ? ?EKG ? ? ?Radiology ?No results found. ? ?Procedures ?Procedures (including critical care time) ? ?Medications Ordered in UC ?Medications - No data to display ? ?Initial Impression / Assessment and Plan / UC Course  ?I have reviewed the triage vital signs and the nursing notes. ? ?Pertinent labs & imaging results that were available during my care of the patient were reviewed by me and considered in my medical decision making (see chart for details). ? ?  ?Generalized abdominal and cramping  ?Suspect GERD may be underlying etiology of symptoms. ?Trial famotidine BID PRN X 5 days.  ?Follow-up with PCP as needed. ?Strict ER precautions given if symptoms worsen or doesn't improve. ?Final Clinical Impressions(s) / UC Diagnoses  ? ?Final diagnoses:  ?Generalized abdominal pain  ?Abdominal cramping  ? ? ? ?Discharge Instructions   ? ?  ?Recommend a trial famotidine once to twice  daily for 5 days to reduce any excessive bowel acid that may be contributing to abdominal pain.  ?If symptoms persist discuss with PCP the need for further diagnostic work-up/imaging.  ? ?If your symptoms worsens or become severe go immediately to the Emergency Department. ? ? ? ? ?ED Prescriptions   ? ? Medication Sig Dispense Auth. Provider  ? famotidine (PEPCID) 20 MG tablet Take 1 tablet (20 mg total) by mouth 2 (two) times daily as needed for up to 5 days for heartburn or indigestion. 10 tablet Scot Jun, FNP  ? ?  ? ?PDMP not reviewed this encounter. ?  ?Scot Jun, FNP ?03/01/22 2311 ? ?

## 2022-03-02 ENCOUNTER — Ambulatory Visit: Payer: BC Managed Care – PPO | Admitting: Nurse Practitioner

## 2022-03-02 ENCOUNTER — Encounter: Payer: Self-pay | Admitting: Nurse Practitioner

## 2022-03-02 VITALS — BP 104/76 | HR 78 | Temp 97.5°F | Resp 14 | Ht 71.0 in | Wt 178.4 lb

## 2022-03-02 DIAGNOSIS — R1084 Generalized abdominal pain: Secondary | ICD-10-CM

## 2022-03-02 LAB — HEPATIC FUNCTION PANEL
ALT: 14 U/L (ref 0–53)
AST: 15 U/L (ref 0–37)
Albumin: 4.8 g/dL (ref 3.5–5.2)
Alkaline Phosphatase: 47 U/L (ref 39–117)
Bilirubin, Direct: 0.1 mg/dL (ref 0.0–0.3)
Total Bilirubin: 0.7 mg/dL (ref 0.2–1.2)
Total Protein: 7.2 g/dL (ref 6.0–8.3)

## 2022-03-02 LAB — CBC
HCT: 43.1 % (ref 39.0–52.0)
Hemoglobin: 15 g/dL (ref 13.0–17.0)
MCHC: 34.8 g/dL (ref 30.0–36.0)
MCV: 84.8 fl (ref 78.0–100.0)
Platelets: 244 10*3/uL (ref 150.0–400.0)
RBC: 5.08 Mil/uL (ref 4.22–5.81)
RDW: 13.3 % (ref 11.5–15.5)
WBC: 6.3 10*3/uL (ref 4.0–10.5)

## 2022-03-02 LAB — H. PYLORI ANTIBODY, IGG: H Pylori IgG: NEGATIVE

## 2022-03-02 LAB — LIPASE: Lipase: 4 U/L — ABNORMAL LOW (ref 11.0–59.0)

## 2022-03-02 NOTE — Progress Notes (Signed)
? ?  Acute Office Visit ? ?Subjective:  ? ?  ?Patient ID: Jose Estes, male    DOB: Jun 12, 1991, 31 y.o.   MRN: [id number] ? ?Chief Complaint  ?Patient presents with  ? Abdominal Pain  ?  X 1 week. Off and on. Generalized abdominal area, pain described as sharp, achy, feels like gas/hunger pain, sometimes burning sensation. Went to Urgent Care on 02/27/22 and was started on Pepcid and that helped some. No vomiting.   ? ? ? ?Patient is in today for generalized abdominal pain ? ?Started approx 1 week ago. States that he was at work and had a coffee. Felt achy after the coffee. States that it abated then came back around lunch. States that after he ate it went away. States that it has been intermittent. He was [facility] on Friday and states that they gave him some pepcid. States that it has helped some.  ?Cannot correlate anything that makes it better or worse. States that he has increased red meat. Eats more keto and does IF. ?Not recent alcohol consumption. Former Event organiser, stopped in 2020. ?That he is under quite a bit of stress currently just welcomed a new baby to the world and currently working 3 jobs per patient report.  Also has a history of GERD ? ?Review of Systems  ?Constitutional:  Negative for chills and fever.  ?Gastrointestinal:  Positive for abdominal pain. Negative for constipation, diarrhea, nausea and vomiting.  ?     Bm daily  ?Genitourinary:  Negative for dysuria, frequency and hematuria.  ? ? ?   ?Objective:  ?  ?BP 104/76   Pulse 78   Temp (!) 97.5 ?F (36.4 ?C)   Resp 14   Ht '5\' 11"'$  (1.803 m)   Wt 178 lb 6 oz (80.9 kg)   SpO2 97%   BMI 24.88 kg/m?  ? ? ?Physical Exam ?Vitals and nursing note reviewed.  ?Constitutional:   ?   Appearance: He is well-developed.  ?Cardiovascular:  ?   Rate and Rhythm: Normal rate and regular rhythm.  ?   Heart sounds: Normal heart sounds.  ?Pulmonary:  ?   Effort: Pulmonary effort is normal.  ?   Breath sounds: Normal breath sounds.  ?Abdominal:  ?   General:  Bowel sounds are normal. There is no distension.  ?   Palpations: There is no mass.  ?   Tenderness: There is no abdominal tenderness.  ?   Hernia: No hernia is present.  ?Neurological:  ?   Mental Status: He is alert.  ? ? ?No results found for any visits on 03/02/22. ? ? ?   ?Assessment & Plan:  ? ?Problem List Items Addressed This Visit   ? ?  ? Other  ? Generalized abdominal pain - Primary  ?  Nonspecific generalized abdominal pain.  Patient was seen in urgent care written Pepcid which is helped.  We will check on some labs, pending result.  Continue taking Pepcid as prescribed from urgent care as it seems to be beneficial.  Did discuss possibility of adding a probiotic to diet these can be purchased over-the-counter.  Follow-up if no improvement ? ?  ?  ? Relevant Orders  ? CBC  ? Hepatic function panel  ? H. pylori antibody, IgG  ? Lipase  ? ? ?No orders of the defined types were placed in this encounter. ? ? ?Return if symptoms worsen or fail to improve. ? ?Jose Garret, NP ? ? ?

## 2022-03-02 NOTE — Assessment & Plan Note (Signed)
Nonspecific generalized abdominal pain.  Patient was seen in urgent care written Pepcid which is helped.  We will check on some labs, pending result.  Continue taking Pepcid as prescribed from urgent care as it seems to be beneficial.  Did discuss possibility of adding a probiotic to diet these can be purchased over-the-counter.  Follow-up if no improvement ?

## 2022-03-02 NOTE — Patient Instructions (Signed)
Nice to see you today ?I will be in touch with the lab results once I have them ?Finish the Pepcid prescription since it seems to be helping ?Follow up if no improvement or symptoms worsen ?

## 2022-03-03 ENCOUNTER — Ambulatory Visit: Payer: BC Managed Care – PPO | Admitting: Family Medicine

## 2022-03-05 ENCOUNTER — Encounter: Payer: Self-pay | Admitting: Nurse Practitioner

## 2022-08-10 ENCOUNTER — Encounter: Payer: Self-pay | Admitting: Family Medicine

## 2022-08-10 ENCOUNTER — Ambulatory Visit (INDEPENDENT_AMBULATORY_CARE_PROVIDER_SITE_OTHER): Payer: Self-pay | Admitting: Family Medicine

## 2022-08-10 VITALS — BP 100/70 | HR 88 | Temp 97.9°F | Ht 71.0 in | Wt 174.2 lb

## 2022-08-10 DIAGNOSIS — H9312 Tinnitus, left ear: Secondary | ICD-10-CM

## 2022-08-10 NOTE — Progress Notes (Signed)
Jose Estes T. Jose Klose, MD, [place] at [facility] [place], [address]  Phone: [phone]  FAX: [phone]  Jose Estes - 31 y.o. male  MRN [id number]  Date of Birth: [birth year]  Date: 08/10/2022  PCP: Ria Bush, MD  Referral: Ria Bush, MD  Chief Complaint  Patient presents with   Tinnitus    Left Ear-Started yesterday   Subjective:   Jose Estes is a 31 y.o. very pleasant male patient with Body mass index is 24.3 kg/m. who presents with the following:  Ear ringing: He presents with some ear ringing in the left ear this been going on for about 24 to 48 hours.  No trauma or injury.  No ear discharge.  No pain.  Relevant history of having been sick for about the last 2 to 2-1/2 weeks that he is now getting over.  No discharge from the ear.  On the L ear.   Just got over a cold - past 2 1/2 weeks with chest congestion.    Review of Systems is noted in the HPI, as appropriate  Objective:   BP 100/70   Pulse 88   Temp 97.9 F (36.6 C) (Oral)   Ht '5\' 11"'$  (1.803 m)   Wt 174 lb 4 oz (79 kg)   SpO2 97%   BMI 24.30 kg/m   GEN: No acute distress; alert,appropriate. PULM: Breathing comfortably in no respiratory distress PSYCH: Normally interactive.  TMs are clear bilaterally without fluid.  There is no cerumen impaction.  The tympanic membrane is perfectly normal, and the canal itself is not swollen with no signs of other infection. No lymphadenopathy  Laboratory and Imaging Data:  Assessment and Plan:     ICD-10-CM   1. Tinnitus, left  H93.12      Left-sided acute tenderness, most likely will be self-limited and go away on its own.  Reassurance.  Hopefully, this will not become a chronic issue for him.  Medication Management during today's office visit: No orders of the defined types were placed in this encounter.  Medications Discontinued During This Encounter  Medication  Reason   zinc gluconate 50 MG tablet Patient Preference   famotidine (PEPCID) 20 MG tablet Completed Course   Cyanocobalamin (VITAMIN B-12) 2500 MCG SUBL Patient Preference   Cholecalciferol (VITAMIN D3) 25 MCG (1000 UT) CAPS Patient Preference   Omega-3 Fatty Acids (FISH OIL PO) Patient Preference    Orders placed today for conditions managed today: No orders of the defined types were placed in this encounter.   Disposition: No follow-ups on file.  [employer] One speech-to-text software was used for transcription in this dictation.  Possible transcriptional errors can occur using Editor, commissioning.   Signed,  Maud Deed. Treyvonne Tata, MD   Outpatient Encounter Medications as of 08/10/2022  Medication Sig   MAGNESIUM PO Take 400 mg by mouth at bedtime. Powder   [DISCONTINUED] Cholecalciferol (VITAMIN D3) 25 MCG (1000 UT) CAPS Take 1 capsule (1,000 Units total) by mouth daily.   [DISCONTINUED] Cyanocobalamin (VITAMIN B-12) 2500 MCG SUBL Place 1 tablet under the tongue daily. (Patient not taking: Reported on 03/02/2022)   [DISCONTINUED] famotidine (PEPCID) 20 MG tablet Take 1 tablet (20 mg total) by mouth 2 (two) times daily as needed for up to 5 days for heartburn or indigestion.   [DISCONTINUED] Omega-3 Fatty Acids (FISH OIL PO) Take by mouth at bedtime.   [DISCONTINUED] zinc gluconate 50 MG tablet Take 1 tablet (50  mg total) by mouth daily. TINCTURE OF ZINC   No facility-administered encounter medications on file as of 08/10/2022.

## 2022-11-01 ENCOUNTER — Ambulatory Visit
Admission: EM | Admit: 2022-11-01 | Discharge: 2022-11-01 | Disposition: A | Discharge status: Home / Self Care | Payer: Self-pay | Attending: Emergency Medicine | Admitting: Emergency Medicine

## 2022-11-01 DIAGNOSIS — J020 Streptococcal pharyngitis: Secondary | ICD-10-CM

## 2022-11-01 LAB — POCT RAPID STREP A (OFFICE): Rapid Strep A Screen: POSITIVE — AB

## 2022-11-01 MED ORDER — AMOXICILLIN 400 MG/5ML PO SUSR: 500.0000 mg | Freq: Two times a day (BID) | ORAL | 0 refills | Status: AC

## 2022-11-01 NOTE — Discharge Instructions (Addendum)
Take the amoxicillin as directed.  Follow up with your primary care provider if your symptoms are not improving.   ° ° °

## 2022-11-01 NOTE — ED Triage Notes (Addendum)
Patient to Urgent Care with complaints of sore throat and swollen glands. States he is having some neck pain due to the discomfort of his throat. Reports Friday night he had some discharge form his right eye, used a salt water compress.   Symptoms started four days ago.   Denies any known fevers.   Has been taking tylenol and ibuprofen. Last dose of ibuprofen this am.

## 2022-11-01 NOTE — ED Provider Notes (Signed)
Jose Estes    CSN: [id number] Arrival date & time: 11/01/22  7096      History   Chief Complaint Chief Complaint  Patient presents with   Sore Throat    HPI Jose Estes is a 31 y.o. male.  Patient presents with sore throat x 4 days.  No fever, rash, cough, shortness of breath, or other symptoms.  He had right eye drainage 2 days ago but this resolved.  Treatment at home with Tylenol and ibuprofen.  His medical history includes depression and anxiety.    The history is provided by the patient and medical records.    Past Medical History:  Diagnosis Date   Depression 2012   Paronychia of left thumb 2015    Patient Active Problem List   Diagnosis Date Noted   Generalized abdominal pain 03/02/2022   Thinning hair 02/17/2022   Skin nodule 10/22/2021   Testicle pain 05/06/2021   Difficulty swallowing pills 04/28/2021   Health maintenance examination 05/25/2016   GAD (generalized anxiety disorder) 05/25/2016    Past Surgical History:  Procedure Laterality Date   TONSILLECTOMY  2011       Home Medications    Prior to Admission medications   Medication Sig Start Date End Date Taking? Authorizing Provider  amoxicillin (AMOXIL) 400 MG/5ML suspension Take 6.3 mLs (500 mg total) by mouth 2 (two) times daily for 10 days. 11/01/22 11/11/22 Yes Sharion Balloon, NP  MAGNESIUM PO Take 400 mg by mouth at bedtime. Powder    [provider]    Family History Family History  Problem Relation Age of Onset   Anxiety disorder Mother    Depression Mother    Hypertension Father    Anxiety disorder Maternal Grandmother    Arthritis Maternal Grandmother    CAD Neg Hx    Stroke Neg Hx    Diabetes Neg Hx    Cancer Neg Hx     Social History Social History   Tobacco Use   Smoking status: Former    Types: E-cigarettes    Quit date: 04/02/2016    Years since quitting: 6.5   Smokeless tobacco: Never  Vaping Use   Vaping Use: Never used  Substance Use  Topics   Alcohol use: Not Currently    Comment: social   Drug use: Not Currently    Types: Marijuana     Allergies   Patient has no known allergies.   Review of Systems Review of Systems  Constitutional:  Negative for chills and fever.  HENT:  Positive for sore throat. Negative for ear pain.   Eyes:  Negative for pain, discharge, redness and visual disturbance.  Respiratory:  Negative for cough and shortness of breath.   Cardiovascular:  Negative for chest pain and palpitations.  Gastrointestinal:  Negative for diarrhea and vomiting.  Skin:  Negative for color change and rash.  All other systems reviewed and are negative.    Physical Exam Triage Vital Signs ED Triage Vitals  Enc Vitals Group     BP 11/01/22 0946 121/77     Pulse Rate 11/01/22 0946 74     Resp 11/01/22 0946 18     Temp 11/01/22 0946 98.7 F (37.1 C)     Temp src --      SpO2 11/01/22 0946 98 %     Weight 11/01/22 0944 165 lb (74.8 kg)     Height 11/01/22 0944 '5\' 11"'$  (1.803 m)     Head Circumference --  Peak Flow --      Pain Score 11/01/22 0944 2     Pain Loc --      Pain Edu? --      Excl. in [place]? --    No data found.  Updated Vital Signs BP 121/77   Pulse 74   Temp 98.7 F (37.1 C)   Resp 18   Ht '5\' 11"'$  (1.803 m)   Wt 165 lb (74.8 kg)   SpO2 98%   BMI 23.01 kg/m   Visual Acuity Right Eye Distance:   Left Eye Distance:   Bilateral Distance:    Right Eye Near:   Left Eye Near:    Bilateral Near:     Physical Exam Vitals and nursing note reviewed.  Constitutional:      General: He is not in acute distress.    Appearance: Normal appearance. He is well-developed. He is not ill-appearing.  HENT:     Right Ear: Tympanic membrane normal.     Left Ear: Tympanic membrane normal.     Mouth/Throat:     Mouth: Mucous membranes are moist.     Pharynx: Posterior oropharyngeal erythema present.  Eyes:     Conjunctiva/sclera: Conjunctivae normal.  Cardiovascular:     Rate and  Rhythm: Normal rate and regular rhythm.     Heart sounds: Normal heart sounds.  Pulmonary:     Effort: Pulmonary effort is normal. No respiratory distress.     Breath sounds: Normal breath sounds.  Musculoskeletal:     Cervical back: Neck supple.  Skin:    General: Skin is warm and dry.  Neurological:     Mental Status: He is alert.  Psychiatric:        Mood and Affect: Mood normal.        Behavior: Behavior normal.      UC Treatments / Results  Labs (all labs ordered are listed, but only abnormal results are displayed) Labs Reviewed  POCT RAPID STREP A (OFFICE) - Abnormal; Notable for the following components:      Result Value   Rapid Strep A Screen Positive (*)    All other components within normal limits    EKG   Radiology No results found.  Procedures Procedures (including critical care time)  Medications Ordered in UC Medications - No data to display  Initial Impression / Assessment and Plan / UC Course  I have reviewed the triage vital signs and the nursing notes.  Pertinent labs & imaging results that were available during my care of the patient were reviewed by me and considered in my medical decision making (see chart for details).    Strep pharyngitis.  Rapid strep positive.  Treating with amoxicillin (liquid per patient request).  Discussed symptomatic treatment including Tylenol, rest, hydration.  Instructed patient to follow up with his PCP if symptoms are not improving.  He agrees to plan of care.   Final Clinical Impressions(s) / UC Diagnoses   Final diagnoses:  Strep pharyngitis     Discharge Instructions      Take the amoxicillin as directed.  Follow up with your primary care provider if your symptoms are not improving.        ED Prescriptions     Medication Sig Dispense Auth. Provider   amoxicillin (AMOXIL) 400 MG/5ML suspension Take 6.3 mLs (500 mg total) by mouth 2 (two) times daily for 10 days. 126 mL Sharion Balloon, NP       PDMP not reviewed this  encounter.   Sharion Balloon, NP 11/01/22 1020

## 2022-11-08 ENCOUNTER — Encounter: Payer: Self-pay | Admitting: Family Medicine

## 2022-11-13 MED ORDER — NYSTATIN 100000 UNIT/ML MT SUSP: 5.0000 mL | Freq: Three times a day (TID) | OROMUCOSAL | 0 refills | Status: DC

## 2022-11-13 NOTE — Addendum Note (Signed)
Addended by: Ria Bush on: 11/13/2022 07:35 AM   Modules accepted: Orders

## 2023-02-12 ENCOUNTER — Encounter: Payer: Self-pay | Admitting: Family Medicine

## 2023-02-12 ENCOUNTER — Ambulatory Visit (INDEPENDENT_AMBULATORY_CARE_PROVIDER_SITE_OTHER): Payer: Self-pay | Admitting: Family Medicine

## 2023-02-12 VITALS — BP 100/60 | HR 77 | Temp 98.3°F | Ht 71.0 in | Wt 175.5 lb

## 2023-02-12 DIAGNOSIS — J029 Acute pharyngitis, unspecified: Secondary | ICD-10-CM

## 2023-02-12 LAB — POCT RAPID STREP A (OFFICE): Rapid Strep A Screen: NEGATIVE

## 2023-02-12 NOTE — Assessment & Plan Note (Signed)
Negative strep test.  Most likely viral versus allergic pharyngitis.  Treat with Tylenol or ibuprofen as discussed. Can consider antihistamine ( clarintin) versus Flonase 2 sprays per nostril daily for possible allergy. Expect 7 to 10 days of symptoms but if symptoms persisting beyond that please let us know. 

## 2023-02-12 NOTE — Patient Instructions (Signed)
Negative strep test.  Most likely viral versus allergic pharyngitis.  Treat with Tylenol or ibuprofen as discussed. Can consider antihistamine ( clarintin) versus Flonase 2 sprays per nostril daily for possible allergy. Expect 7 to 10 days of symptoms but if symptoms persisting beyond that please let us know.

## 2023-02-12 NOTE — Progress Notes (Signed)
Patient ID: Jose Estes, male    DOB: 1991/01/24, 32 y.o.   MRN: [id number]  This visit was conducted in person.  BP 100/60   Pulse 77   Temp 98.3 F (36.8 C) (Temporal)   Ht  (1.803 m)   Wt 175 lb 8 oz (79.6 kg)   SpO2 97%   BMI 24.48 kg/m    CC:  Chief Complaint  Patient presents with   Sore Throat    Subjective:   HPI: Jose Estes is a 32 y.o. male presenting on 02/12/2023 for Sore Throat   Date of onset: 3 days Initial symptoms included  severe ST, mild chest congestion off and on, dry cough  Right ear pain,   mild sinus pressure, mild nasal congesiton  No SOB, no wheeze.  No fever, chills.  No body aches.   Sick contacts:  daycare child.. ear infection COVID testing:   none    He has tried to treat with  tylenol, ginger tea.    Had COVID and strep in 11/2022  No history of chronic lung disease such as asthma or COPD.  Former e cig smoker.       Relevant past medical, surgical, family and social history reviewed and updated as indicated. Interim medical history since our last visit reviewed. Allergies and medications reviewed and updated. Outpatient Medications Prior to Visit  Medication Sig Dispense Refill   MAGNESIUM PO Take 400 mg by mouth at bedtime. Powder     nystatin (MYCOSTATIN) 100000 UNIT/ML suspension Take 5 mLs (500,000 Units total) by mouth in the morning, at noon, and at bedtime. 100 mL 0   No facility-administered medications prior to visit.     Per HPI unless specifically indicated in ROS section below Review of Systems  Constitutional:  Negative for fatigue and fever.  HENT:  Positive for sore throat. Negative for ear pain.   Eyes:  Negative for pain.  Respiratory:  Negative for cough and shortness of breath.   Cardiovascular:  Negative for chest pain, palpitations and leg swelling.  Gastrointestinal:  Negative for abdominal pain.  Genitourinary:  Negative for dysuria.  Musculoskeletal:  Negative for arthralgias.   Neurological:  Negative for syncope, light-headedness and headaches.  Psychiatric/Behavioral:  Negative for dysphoric mood.    Objective:  BP 100/60   Pulse 77   Temp 98.3 F (36.8 C) (Temporal)   Ht  (1.803 m)   Wt 175 lb 8 oz (79.6 kg)   SpO2 97%   BMI 24.48 kg/m   Wt Readings from Last 3 Encounters:  02/12/23 175 lb 8 oz (79.6 kg)  11/01/22 165 lb (74.8 kg)  08/10/22 174 lb 4 oz (79 kg)      Physical Exam Constitutional:      General: He is not in acute distress.    Appearance: Normal appearance. He is well-developed. He is not ill-appearing or toxic-appearing.  HENT:     Head: Normocephalic and atraumatic.     Right Ear: Hearing, tympanic membrane, ear canal and external ear normal. No tenderness. No foreign body. Tympanic membrane is not retracted or bulging.     Left Ear: Hearing, tympanic membrane, ear canal and external ear normal. No tenderness. No foreign body. Tympanic membrane is not retracted or bulging.     Nose: Nose normal. No mucosal edema or rhinorrhea.     Right Sinus: No maxillary sinus tenderness or frontal sinus tenderness.     Left Sinus: No maxillary sinus tenderness or  frontal sinus tenderness.     Mouth/Throat:     Dentition: Normal dentition. No dental caries.     Pharynx: Uvula midline. Posterior oropharyngeal erythema present. No oropharyngeal exudate.     Tonsils: No tonsillar exudate or tonsillar abscesses.  Eyes:     General: Lids are normal. Lids are everted, no foreign bodies appreciated.     Conjunctiva/sclera: Conjunctivae normal.     Pupils: Pupils are equal, round, and reactive to light.  Neck:     Thyroid: No thyroid mass or thyromegaly.     Vascular: No carotid bruit.     Trachea: Trachea and phonation normal.  Cardiovascular:     Rate and Rhythm: Normal rate and regular rhythm.     Pulses: Normal pulses.     Heart sounds: Normal heart sounds, S1 normal and S2 normal. No murmur heard.    No gallop.  Pulmonary:     Effort:  Pulmonary effort is normal. No respiratory distress.     Breath sounds: Normal breath sounds. No wheezing, rhonchi or rales.  Abdominal:     General: Bowel sounds are normal.     Palpations: Abdomen is soft.     Tenderness: There is no abdominal tenderness. There is no guarding or rebound.     Hernia: No hernia is present.  Musculoskeletal:     Cervical back: Normal range of motion and neck supple.  Skin:    General: Skin is warm and dry.     Findings: No rash.  Neurological:     Mental Status: He is alert.     Deep Tendon Reflexes: Reflexes are normal and symmetric.  Psychiatric:        Speech: Speech normal.        Behavior: Behavior normal.        Judgment: Judgment normal.       Results for orders placed or performed during the hospital encounter of 11/01/22  POCT rapid strep A  Result Value Ref Range   Rapid Strep A Screen Positive (A) Negative    Assessment and Plan  Sore throat Assessment & Plan:  Negative strep test.  Most likely viral versus allergic pharyngitis.  Treat with Tylenol or ibuprofen as discussed. Can consider antihistamine ( clarintin) versus Flonase 2 sprays per nostril daily for possible allergy. Expect 7 to 10 days of symptoms but if symptoms persisting beyond that please let us know.     No follow-ups on file.   Kerby Nora, MD

## 2023-04-15 ENCOUNTER — Other Ambulatory Visit: Payer: Self-pay | Admitting: Family Medicine

## 2023-04-15 ENCOUNTER — Other Ambulatory Visit (INDEPENDENT_AMBULATORY_CARE_PROVIDER_SITE_OTHER): Payer: Self-pay

## 2023-04-15 DIAGNOSIS — Z131 Encounter for screening for diabetes mellitus: Secondary | ICD-10-CM

## 2023-04-15 DIAGNOSIS — E559 Vitamin D deficiency, unspecified: Secondary | ICD-10-CM

## 2023-04-15 DIAGNOSIS — Z13220 Encounter for screening for lipoid disorders: Secondary | ICD-10-CM

## 2023-04-15 DIAGNOSIS — Z136 Encounter for screening for cardiovascular disorders: Secondary | ICD-10-CM

## 2023-04-15 LAB — LIPID PANEL
Cholesterol: 227 mg/dL — ABNORMAL HIGH (ref 0–200)
HDL: 55.3 mg/dL (ref >39.00)
LDL Cholesterol: 158 mg/dL — ABNORMAL HIGH (ref 0–99)
NonHDL: 171.95
Total CHOL/HDL Ratio: 4
Triglycerides: 68 mg/dL (ref 0.0–149.0)
VLDL: 13.6 mg/dL (ref 0.0–40.0)

## 2023-04-15 LAB — BASIC METABOLIC PANEL
BUN: 16 mg/dL (ref 6–23)
CO2: 30 mEq/L (ref 19–32)
Calcium: 9.2 mg/dL (ref 8.4–10.5)
Chloride: 103 mEq/L (ref 96–112)
Creatinine, Ser: 0.92 mg/dL (ref 0.40–1.50)
GFR: 110.2 mL/min (ref >60.00)
Glucose, Bld: 96 mg/dL (ref 70–99)
Potassium: 4.7 mEq/L (ref 3.5–5.1)
Sodium: 139 mEq/L (ref 135–145)

## 2023-04-15 LAB — VITAMIN D 25 HYDROXY (VIT D DEFICIENCY, FRACTURES): VITD: 27.81 ng/mL — ABNORMAL LOW (ref 30.00–100.00)

## 2023-04-20 ENCOUNTER — Ambulatory Visit (INDEPENDENT_AMBULATORY_CARE_PROVIDER_SITE_OTHER): Payer: Self-pay | Admitting: Family Medicine

## 2023-04-20 ENCOUNTER — Encounter: Payer: Self-pay | Admitting: Family Medicine

## 2023-04-20 VITALS — BP 118/78 | HR 69 | Temp 97.4°F | Ht 71.0 in | Wt 171.0 lb

## 2023-04-20 DIAGNOSIS — E559 Vitamin D deficiency, unspecified: Secondary | ICD-10-CM

## 2023-04-20 DIAGNOSIS — E785 Hyperlipidemia, unspecified: Secondary | ICD-10-CM | POA: Insufficient documentation

## 2023-04-20 DIAGNOSIS — F411 Generalized anxiety disorder: Secondary | ICD-10-CM

## 2023-04-20 DIAGNOSIS — Z0000 Encounter for general adult medical examination without abnormal findings: Secondary | ICD-10-CM

## 2023-04-20 DIAGNOSIS — E7800 Pure hypercholesterolemia, unspecified: Secondary | ICD-10-CM

## 2023-04-20 NOTE — Assessment & Plan Note (Signed)
Continue daily vit D replacement.  

## 2023-04-20 NOTE — Assessment & Plan Note (Signed)
Preventative protocols reviewed and updated unless pt declined. Discussed healthy diet and lifestyle.  

## 2023-04-20 NOTE — Patient Instructions (Addendum)
Good to see you today You are doing well today Work on healthy diet and regular aerobic exercise. Return as needed or in 1 year for next physical

## 2023-04-20 NOTE — Progress Notes (Signed)
Ph: [phone] Fax: [phone]   Patient ID: Jose Estes, male    DOB: 01/25/1991, 32 y.o.   MRN: [id number]  This visit was conducted in person.  BP 118/78   Pulse 69   Temp (!) 97.4 F (36.3 C) (Temporal)   Ht 5\' 11"  (1.803 m)   Wt 171 lb (77.6 kg)   SpO2 98%   BMI 23.85 kg/m    CC: CPE Subjective:   HPI: Jose Estes is a 32 y.o. male presenting on 04/20/2023 for No chief complaint on file.   H/o anxiety - stable period off medication, managing with healthy diet, regular exercise, decreased sugar intake. notes difficulty with focus.  Takes magnesium glycinate, vit D, vit K supplement.   Preventative: Flu shot - declined COVID vaccine - declined  Tdap 04/2019 Seat belt use discussed.  Sunscreen use discussed. 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 Interim medical history since our last visit reviewed. Allergies and medications reviewed and updated. Outpatient Medications Prior to Visit  Medication Sig Dispense Refill   MAGNESIUM PO Take 400 mg by mouth at bedtime. Powder     Vitamin D-Vitamin K (VITAMIN K2-VITAMIN D3 PO) Take by mouth daily.     No facility-administered medications prior to visit.     Per HPI unless specifically indicated in ROS section below Review of Systems  Constitutional:  Negative for activity change, appetite change, chills, fatigue, fever and unexpected weight change.  HENT:  Negative for hearing loss.   Eyes:  Negative for visual disturbance.   Respiratory:  Negative for cough, chest tightness, shortness of breath and wheezing.   Cardiovascular:  Negative for chest pain, palpitations and leg swelling.  Gastrointestinal:  Positive for nausea (occ - ?diet related). Negative for abdominal distention, abdominal pain, blood in stool, constipation, diarrhea and vomiting.  Genitourinary:  Negative for difficulty urinating and hematuria.  Musculoskeletal:  Negative for arthralgias, myalgias and neck pain.  Skin:  Negative for rash.  Neurological:  Positive for dizziness (occ) and headaches (occ). Negative for seizures and syncope.  Hematological:  Negative for adenopathy. Does not bruise/bleed easily.  Psychiatric/Behavioral:  Negative for dysphoric mood. The patient is not nervous/anxious.     Objective:  BP 118/78   Pulse 69   Temp (!) 97.4 F (36.3 C) (Temporal)   Ht 5\' 11"  (1.803 m)   Wt 171 lb (77.6 kg)   SpO2 98%   BMI 23.85 kg/m   Wt Readings from Last 3 Encounters:  04/20/23 171 lb (77.6 kg)  02/12/23 175 lb 8 oz (79.6 kg)  11/01/22 165 lb (74.8 kg)      Physical Exam Vitals and nursing note reviewed.  Constitutional:      General: He is not in acute distress.    Appearance: Normal appearance. He is well-developed. He is not ill-appearing.  HENT:     Head: Normocephalic and atraumatic.     Right Ear: Hearing, tympanic membrane, ear canal and external ear normal.     Left Ear: Hearing,  tympanic membrane, ear canal and external ear normal.     Nose: Nose normal.     Mouth/Throat:     Mouth: Mucous membranes are moist.     Pharynx: Oropharynx is clear. No oropharyngeal exudate or posterior oropharyngeal erythema.  Eyes:     General: No scleral icterus.    Extraocular Movements: Extraocular movements intact.     Conjunctiva/sclera: Conjunctivae normal.     Pupils: Pupils are equal, round, and reactive to light.  Neck:     Thyroid: No thyroid mass or thyromegaly.  Cardiovascular:     Rate and Rhythm: Normal rate  and regular rhythm.     Pulses: Normal pulses.          Radial pulses are 2+ on the right side and 2+ on the left side.     Heart sounds: Normal heart sounds. No murmur heard. Pulmonary:     Effort: Pulmonary effort is normal. No respiratory distress.     Breath sounds: Normal breath sounds. No wheezing, rhonchi or rales.  Abdominal:     General: Bowel sounds are normal. There is no distension.     Palpations: Abdomen is soft. There is no mass.     Tenderness: There is no abdominal tenderness. There is no guarding or rebound.     Hernia: No hernia is present.  Musculoskeletal:        General: Normal range of motion.     Cervical back: Normal range of motion and neck supple.     Right lower leg: No edema.     Left lower leg: No edema.  Lymphadenopathy:     Cervical: No cervical adenopathy.  Skin:    General: Skin is warm and dry.     Findings: No rash.  Neurological:     General: No focal deficit present.     Mental Status: He is alert and oriented to person, place, and time.  Psychiatric:        Mood and Affect: Mood normal.        Behavior: Behavior normal.        Thought Content: Thought content normal.        Judgment: Judgment normal.       Results for orders placed or performed in visit on 04/15/23  VITAMIN D 25 Hydroxy (Vit-D Deficiency, Fractures)  Result Value Ref Range   VITD 27.81 (L) 30.00 - 100.00 ng/mL  Lipid panel  Result Value Ref Range   Cholesterol 227 (H) 0 - 200 mg/dL   Triglycerides 29.5 0.0 - 149.0 mg/dL   HDL 62.13 >08.65 mg/dL   VLDL 78.4 0.0 - 69.6 mg/dL   LDL Cholesterol 295 (H) 0 - 99 mg/dL   Total CHOL/HDL Ratio 4    NonHDL 171.95   Basic metabolic panel  Result Value Ref Range   Sodium 139 135 - 145 mEq/L   Potassium 4.7 3.5 - 5.1 mEq/L   Chloride 103 96 - 112 mEq/L   CO2 30 19 - 32 mEq/L   Glucose, Bld 96 70 - 99 mg/dL   BUN 16 6 - 23 mg/dL   Creatinine, Ser 2.84 0.40 - 1.50 mg/dL   GFR 132.44 >01.02 mL/min   Calcium 9.2 8.4 - 10.5  mg/dL   Lab Results  Component Value Date   TSH 1.82 08/08/2021      04/20/2023    9:33 AM 02/17/2022    9:36 AM 04/28/2021   11:23 AM 02/18/2021   11:26 AM 04/24/2020  4:26 PM  Depression screen PHQ 2/9  Decreased Interest 1 0 1 0 0  Down, Depressed, Hopeless 1 0 1 0 0  PHQ - 2 Score 2 0 2 0 0  Altered sleeping 1 0 0 0 0  Tired, decreased energy 1 2 2 1 1   Change in appetite 1 0 2 0 0  Feeling bad or failure about yourself  1 0 1 0 0  Trouble concentrating 2 2 3 3 1   Moving slowly or fidgety/restless 2 1 2  0 2  Suicidal thoughts 0 0 0 0 0  PHQ-9 Score 10 5 12 4 4   Difficult doing work/chores Somewhat difficult Somewhat difficult          04/20/2023    9:33 AM 02/17/2022    9:37 AM 08/08/2021    7:41 AM 04/28/2021   11:23 AM  GAD 7 : Generalized Anxiety Score  Nervous, Anxious, on Edge 2 1 3 3   Control/stop worrying 1 0 3 2  Worry too much - different things 1 0 3 2  Trouble relaxing 1 0 3 2  Restless 1 1 3 3   Easily annoyed or irritable 1 1 2 2   Afraid - awful might happen 1 1 3 1   Total GAD 7 Score 8 4 20 15   Anxiety Difficulty  Somewhat difficult Very difficult    Assessment & Plan:   Problem List Items Addressed This Visit     Health maintenance examination - Primary (Chronic)    Preventative protocols reviewed and updated unless pt declined. Discussed healthy diet and lifestyle.       GAD (generalized anxiety disorder)    Stable period off medication. Discussed possible PRN hydroxyzine use.       Vitamin D deficiency    Continue daily vit D replacement.       Hyperlipidemia    Reviewed diet choices to improve LDL cholesterol levels.  No fmhx heart disease or stroke        No orders of the defined types were placed in this encounter.   No orders of the defined types were placed in this encounter.   Patient Instructions  Good to see you today You are doing well today Work on healthy diet and regular aerobic exercise. Return as needed or in 1  year for next physical   Follow up plan: Return in about 1 year (around 04/19/2024) for annual exam, prior fasting for blood work.  Eustaquio Boyden, MD

## 2023-04-20 NOTE — Assessment & Plan Note (Signed)
Stable period off medication. Discussed possible PRN hydroxyzine use.

## 2023-04-20 NOTE — Assessment & Plan Note (Addendum)
Reviewed diet choices to improve LDL cholesterol levels.  No fmhx heart disease or stroke

## 2023-06-29 ENCOUNTER — Ambulatory Visit: Payer: Self-pay | Admitting: Internal Medicine

## 2023-08-11 ENCOUNTER — Ambulatory Visit: Payer: Self-pay | Admitting: Family Medicine

## 2023-12-27 ENCOUNTER — Ambulatory Visit: Payer: Self-pay | Admitting: Internal Medicine

## 2024-04-21 ENCOUNTER — Encounter: Payer: Self-pay | Admitting: Family Medicine
